# Patient Record
Sex: Female | Born: 1945 | Race: White | Hispanic: No | Marital: Married | Smoking: Never smoker
Health system: Southern US, Community
[De-identification: ages and names within clinical notes are randomized; demographics above are authoritative.]

## PROBLEM LIST (undated history)

## (undated) DIAGNOSIS — R51 Headache: Secondary | ICD-10-CM

## (undated) DIAGNOSIS — M069 Rheumatoid arthritis, unspecified: Secondary | ICD-10-CM

## (undated) DIAGNOSIS — R519 Headache, unspecified: Secondary | ICD-10-CM

## (undated) DIAGNOSIS — J45909 Unspecified asthma, uncomplicated: Secondary | ICD-10-CM

## (undated) DIAGNOSIS — L570 Actinic keratosis: Secondary | ICD-10-CM

## (undated) DIAGNOSIS — I10 Essential (primary) hypertension: Secondary | ICD-10-CM

## (undated) DIAGNOSIS — M1990 Unspecified osteoarthritis, unspecified site: Secondary | ICD-10-CM

## (undated) DIAGNOSIS — E039 Hypothyroidism, unspecified: Secondary | ICD-10-CM

## (undated) HISTORY — PX: DILATION AND CURETTAGE OF UTERUS: SHX78

## (undated) HISTORY — PX: APPENDECTOMY: SHX54

## (undated) HISTORY — PX: ABDOMINAL HYSTERECTOMY: SHX81

## (undated) HISTORY — DX: Actinic keratosis: L57.0

## (undated) HISTORY — PX: CHOLECYSTECTOMY: SHX55

---

## 2012-12-06 DEATH — deceased

## 2014-07-02 ENCOUNTER — Ambulatory Visit: Admit: 2014-07-02 | Disposition: A | Discharge status: Home / Self Care | Payer: Self-pay | Admitting: Internal Medicine

## 2014-09-18 ENCOUNTER — Encounter: Payer: Self-pay | Admitting: *Deleted

## 2014-09-19 ENCOUNTER — Ambulatory Visit
Admission: RE | Admit: 2014-09-19 | Discharge: 2014-09-19 | Disposition: A | Discharge status: Home / Self Care | Payer: Medicare Other | Source: Ambulatory Visit | Attending: Unknown Physician Specialty | Admitting: Unknown Physician Specialty

## 2014-09-19 ENCOUNTER — Ambulatory Visit: Payer: Medicare Other | Admitting: Anesthesiology

## 2014-09-19 ENCOUNTER — Encounter
Admission: RE | Disposition: A | Discharge status: Home / Self Care | Payer: Self-pay | Source: Ambulatory Visit | Attending: Unknown Physician Specialty

## 2014-09-19 DIAGNOSIS — K648 Other hemorrhoids: Secondary | ICD-10-CM | POA: Diagnosis not present

## 2014-09-19 DIAGNOSIS — I10 Essential (primary) hypertension: Secondary | ICD-10-CM | POA: Insufficient documentation

## 2014-09-19 DIAGNOSIS — Z79899 Other long term (current) drug therapy: Secondary | ICD-10-CM | POA: Insufficient documentation

## 2014-09-19 DIAGNOSIS — Z1211 Encounter for screening for malignant neoplasm of colon: Secondary | ICD-10-CM | POA: Diagnosis not present

## 2014-09-19 DIAGNOSIS — J45909 Unspecified asthma, uncomplicated: Secondary | ICD-10-CM | POA: Diagnosis not present

## 2014-09-19 DIAGNOSIS — Z7982 Long term (current) use of aspirin: Secondary | ICD-10-CM | POA: Diagnosis not present

## 2014-09-19 DIAGNOSIS — E039 Hypothyroidism, unspecified: Secondary | ICD-10-CM | POA: Insufficient documentation

## 2014-09-19 DIAGNOSIS — K5730 Diverticulosis of large intestine without perforation or abscess without bleeding: Secondary | ICD-10-CM | POA: Insufficient documentation

## 2014-09-19 HISTORY — DX: Headache, unspecified: R51.9

## 2014-09-19 HISTORY — DX: Unspecified osteoarthritis, unspecified site: M19.90

## 2014-09-19 HISTORY — DX: Headache: R51

## 2014-09-19 HISTORY — DX: Unspecified asthma, uncomplicated: J45.909

## 2014-09-19 HISTORY — DX: Hypothyroidism, unspecified: E03.9

## 2014-09-19 HISTORY — PX: COLONOSCOPY WITH PROPOFOL: SHX5780

## 2014-09-19 HISTORY — DX: Essential (primary) hypertension: I10

## 2014-09-19 SURGERY — COLONOSCOPY WITH PROPOFOL
Anesthesia: General

## 2014-09-19 MED ORDER — LIDOCAINE HCL (PF) 2 % IJ SOLN
INTRAMUSCULAR | Status: DC | PRN
  Administered 2014-09-19: 50 mg

## 2014-09-19 MED ORDER — SODIUM CHLORIDE 0.9 % IV SOLN: INTRAVENOUS | Status: DC

## 2014-09-19 MED ORDER — PROPOFOL 10 MG/ML IV BOLUS
INTRAVENOUS | Status: DC | PRN
  Administered 2014-09-19: 50 mg via INTRAVENOUS

## 2014-09-19 MED ORDER — FENTANYL CITRATE (PF) 100 MCG/2ML IJ SOLN
INTRAMUSCULAR | Status: DC | PRN
  Administered 2014-09-19: 50 ug via INTRAVENOUS

## 2014-09-19 MED ORDER — PHENYLEPHRINE HCL 10 MG/ML IJ SOLN
INTRAMUSCULAR | Status: DC | PRN
  Administered 2014-09-19 (×2): 100 ug via INTRAVENOUS

## 2014-09-19 MED ORDER — SODIUM CHLORIDE 0.9 % IV SOLN
INTRAVENOUS | Status: DC
  Administered 2014-09-19: 10:00:00 via INTRAVENOUS

## 2014-09-19 MED ORDER — SODIUM CHLORIDE 0.9 % IV SOLN
INTRAVENOUS | Status: DC
  Administered 2014-09-19: 1000 mL via INTRAVENOUS

## 2014-09-19 MED ORDER — ONDANSETRON HCL 4 MG/2ML IJ SOLN
INTRAMUSCULAR | Status: DC | PRN
  Administered 2014-09-19: 4 mg via INTRAVENOUS

## 2014-09-19 MED ORDER — PROPOFOL INFUSION 10 MG/ML OPTIME
INTRAVENOUS | Status: DC | PRN
  Administered 2014-09-19: 120 ug/kg/min via INTRAVENOUS

## 2014-09-19 MED ORDER — DEXAMETHASONE SODIUM PHOSPHATE 10 MG/ML IJ SOLN
INTRAMUSCULAR | Status: DC | PRN
  Administered 2014-09-19: 4 mg via INTRAVENOUS

## 2014-09-19 NOTE — Op Note (Signed)
[facility] Gastroenterology Patient Name: Jean Lawson Procedure Date: 09/19/2014 10:00 AM MRN: [id number] Account #: [id number] Date of Birth: May 17, 1945 Admit Type: Outpatient Age: 69 Room: [facility] ENDO ROOM 1 Gender: Female Note Status: Finalized Procedure:         Colonoscopy Indications:       Screening for colorectal malignant neoplasm Providers:         Manya Silvas, MD Referring MD:      Glendon Axe (Referring MD) Medicines:         Propofol per Anesthesia Complications:     No immediate complications. Procedure:         Pre-Anesthesia Assessment:                    - After reviewing the risks and benefits, the patient was                     deemed in satisfactory condition to undergo the procedure.                    After obtaining informed consent, the colonoscope was                     passed under direct vision. Throughout the procedure, the                     patient's blood pressure, pulse, and oxygen saturations                     were monitored continuously. The Colonoscope was                     introduced through the anus and advanced to the the cecum,                     identified by appendiceal orifice and ileocecal valve. The                     colonoscopy was somewhat difficult due to a tortuous                     colon. Successful completion of the procedure was aided by                     applying abdominal pressure. The patient tolerated the                     procedure well. The quality of the bowel preparation was                     excellent. Findings:      Multiple small and large-mouthed diverticula were found in the sigmoid       colon and in the descending colon. The colon was very tortuous in the       sigmoid colon.      Internal hemorrhoids were found during endoscopy. The hemorrhoids were       small and Grade I (internal hemorrhoids that do not prolapse).      The exam was otherwise without  abnormality. Impression:        - Diverticulosis in the sigmoid colon and in the                     descending colon.                    -  Internal hemorrhoids.                    - The examination was otherwise normal.                    - No specimens collected. Recommendation:    - The findings and recommendations were discussed with the                     patient's family. Manya Silvas, MD 09/19/2014 10:51:54 AM This report has been signed electronically. Number of Addenda: 0 Note Initiated On: 09/19/2014 10:00 AM Scope Withdrawal Time: 0 hours 9 minutes 42 seconds  Total Procedure Duration: 0 hours 32 minutes 8 seconds       [facility]

## 2014-09-19 NOTE — Transfer of Care (Signed)
Immediate Anesthesia Transfer of Care Note  Patient: Jean Lawson  Procedure(s) Performed: Procedure(s): COLONOSCOPY WITH PROPOFOL (N/A)  Patient Location: PACU  Anesthesia Type:General  Level of Consciousness: sedated  Airway & Oxygen Therapy: Patient Spontanous Breathing and Patient connected to nasal cannula oxygen  Post-op Assessment: Report given to RN and Post -op Vital signs reviewed and stable  Post vital signs: Reviewed and stable  Last Vitals:  Filed Vitals:   09/19/14 0944  BP: 131/67  Pulse: 90  Temp: 36.8 C  Resp: 16    Complications: No apparent anesthesia complications

## 2014-09-19 NOTE — Anesthesia Postprocedure Evaluation (Signed)
  Anesthesia Post-op Note  Patient: Jean Lawson  Procedure(s) Performed: Procedure(s): COLONOSCOPY WITH PROPOFOL (N/A)  Anesthesia type:General  Patient location: PACU  Post pain: Pain level controlled  Post assessment: Post-op Vital signs reviewed, Patient's Cardiovascular Status Stable, Respiratory Function Stable, Patent Airway and No signs of Nausea or vomiting  Post vital signs: Reviewed and stable  Last Vitals:  Filed Vitals:   09/19/14 1120  BP: 125/61  Pulse: 72  Temp:   Resp: 18    Level of consciousness: awake, alert  and patient cooperative  Complications: No apparent anesthesia complications

## 2014-09-19 NOTE — Anesthesia Preprocedure Evaluation (Addendum)
Anesthesia Evaluation  Patient identified by MRN, date of birth, ID band Patient awake    Reviewed: Allergy & Precautions, NPO status , Patient's Chart, lab work & pertinent test results  History of Anesthesia Complications (+) PONV  Airway Mallampati: II  TM Distance: >3 FB Neck ROM: Full    Dental no notable dental hx.    Pulmonary asthma ,  breath sounds clear to auscultation  Pulmonary exam normal       Cardiovascular hypertension, Pt. on medications Normal cardiovascular examRhythm:Regular Rate:Normal     Neuro/Psych  Headaches, negative psych ROS   GI/Hepatic negative GI ROS, Neg liver ROS,   Endo/Other  Hypothyroidism   Renal/GU negative Renal ROS  negative genitourinary   Musculoskeletal negative musculoskeletal ROS (+)   Abdominal   Peds negative pediatric ROS (+)  Hematology negative hematology ROS (+)   Anesthesia Other Findings   Reproductive/Obstetrics negative OB ROS                            Anesthesia Physical Anesthesia Plan  ASA: III  Anesthesia Plan: General   Post-op Pain Management:    Induction:   Airway Management Planned: Nasal Cannula  Additional Equipment:   Intra-op Plan:   Post-operative Plan:   Informed Consent: I have reviewed the patients History and Physical, chart, labs and discussed the procedure including the risks, benefits and alternatives for the proposed anesthesia with the patient or authorized representative who has indicated his/her understanding and acceptance.     Plan Discussed with: CRNA and Surgeon  Anesthesia Plan Comments:         Anesthesia Quick Evaluation

## 2014-09-19 NOTE — H&P (Signed)
Primary Care Physician:  Glendon Axe, MD Primary Gastroenterologist:  Dr. Vira Agar  Pre-Procedure History & Physical: HPI:  Jean Lawson is a 69 y.o. female is here for an colonoscopy.   Past Medical History  Diagnosis Date  . Hypothyroidism   . Headache   . Arthritis   . Hypertension   . Asthma     Past Surgical History  Procedure Laterality Date  . Appendectomy    . Cholecystectomy    . Dilation and curettage of uterus    . Abdominal hysterectomy      Prior to Admission medications   Medication Sig Start Date End Date Taking? Authorizing Provider  aspirin EC 81 MG tablet Take 81 mg by mouth daily.   Yes Historical Provider, MD  calcium carbonate (OS-CAL) 600 MG TABS tablet Take 600 mg by mouth 2 (two) times daily with a meal.   Yes Historical Provider, MD  etanercept (ENBREL) 25 MG injection Inject 25 mg into the skin.   Yes Historical Provider, MD  fluticasone (FLONASE) 50 MCG/ACT nasal spray Place into both nostrils daily.   Yes Historical Provider, MD  Fluticasone-Salmeterol (ADVAIR) 100-50 MCG/DOSE AEPB Inhale 1 puff into the lungs 2 (two) times daily.   Yes Historical Provider, MD  folic acid (FOLVITE) 1 MG tablet Take 1 mg by mouth daily.   Yes Historical Provider, MD  geriatric multivitamins-minerals (ELDERTONIC/GEVRABON) ELIX Take 15 mLs by mouth daily.   Yes Historical Provider, MD  hydrochlorothiazide (HYDRODIURIL) 25 MG tablet Take 25 mg by mouth daily.   Yes Historical Provider, MD  levocetirizine (XYZAL) 5 MG tablet Take 5 mg by mouth every evening.   Yes Historical Provider, MD  lisinopril (PRINIVIL,ZESTRIL) 5 MG tablet Take 5 mg by mouth daily.   Yes Historical Provider, MD  Melatonin 3 MG CAPS Take by mouth.   Yes Historical Provider, MD  methotrexate (RHEUMATREX) 2.5 MG tablet Take 7.5 mg by mouth 3 (three) times a week.   Yes Historical Provider, MD  nabumetone (RELAFEN) 500 MG tablet Take 500 mg by mouth daily.   Yes Historical Provider, MD  OMEGA-3  FATTY ACIDS PO Take by mouth.   Yes Historical Provider, MD  pravastatin (PRAVACHOL) 20 MG tablet Take 20 mg by mouth daily.   Yes Historical Provider, MD  predniSONE (DELTASONE) 2.5 MG tablet Take 2.5 mg by mouth daily with breakfast.   Yes Historical Provider, MD    Allergies as of 08/02/2014  . (Not on File)    No family history on file.  History   Social History  . Marital Status: Married    Spouse Name: N/A  . Number of Children: N/A  . Years of Education: N/A   Occupational History  . Not on file.   Social History Main Topics  . Smoking status: Not on file  . Smokeless tobacco: Not on file  . Alcohol Use: Not on file  . Drug Use: Not on file  . Sexual Activity: Not on file   Other Topics Concern  . Not on file   Social History Narrative  . No narrative on file    Review of Systems: See HPI, otherwise negative ROS  Physical Exam: BP 131/67 mmHg  Pulse 90  Temp(Src) 98.3 F (36.8 C) (Tympanic)  Resp 16  Ht 5' 5.5" (1.664 m)  Wt 54.432 kg (120 lb)  BMI 19.66 kg/m2  SpO2 100% General:   Alert,  pleasant and cooperative in NAD Head:  Normocephalic and atraumatic. Neck:  Supple;  no masses or thyromegaly. Lungs:  Clear throughout to auscultation.    Heart:  Regular rate and rhythm. Abdomen:  Soft, nontender and nondistended. Normal bowel sounds, without guarding, and without rebound.   Neurologic:  Alert and  oriented x4;  grossly normal neurologically.  Impression/Plan: Jean Lawson is here for an colonoscopy to be performed for screening  Risks, benefits, limitations, and alternatives regarding  colonoscopy have been reviewed with the patient.  Questions have been answered.  All parties agreeable.   Gaylyn Cheers, MD  09/19/2014, 9:49 AM   Primary Care Physician:  Glendon Axe, MD Primary Gastroenterologist:  Dr. Vira Agar  Pre-Procedure History & Physical: HPI:  Jean Lawson is a 69 y.o. female is here for an colonoscopy.   Past Medical  History  Diagnosis Date  . Hypothyroidism   . Headache   . Arthritis   . Hypertension   . Asthma     Past Surgical History  Procedure Laterality Date  . Appendectomy    . Cholecystectomy    . Dilation and curettage of uterus    . Abdominal hysterectomy      Prior to Admission medications   Medication Sig Start Date End Date Taking? Authorizing Provider  aspirin EC 81 MG tablet Take 81 mg by mouth daily.   Yes Historical Provider, MD  calcium carbonate (OS-CAL) 600 MG TABS tablet Take 600 mg by mouth 2 (two) times daily with a meal.   Yes Historical Provider, MD  etanercept (ENBREL) 25 MG injection Inject 25 mg into the skin.   Yes Historical Provider, MD  fluticasone (FLONASE) 50 MCG/ACT nasal spray Place into both nostrils daily.   Yes Historical Provider, MD  Fluticasone-Salmeterol (ADVAIR) 100-50 MCG/DOSE AEPB Inhale 1 puff into the lungs 2 (two) times daily.   Yes Historical Provider, MD  folic acid (FOLVITE) 1 MG tablet Take 1 mg by mouth daily.   Yes Historical Provider, MD  geriatric multivitamins-minerals (ELDERTONIC/GEVRABON) ELIX Take 15 mLs by mouth daily.   Yes Historical Provider, MD  hydrochlorothiazide (HYDRODIURIL) 25 MG tablet Take 25 mg by mouth daily.   Yes Historical Provider, MD  levocetirizine (XYZAL) 5 MG tablet Take 5 mg by mouth every evening.   Yes Historical Provider, MD  lisinopril (PRINIVIL,ZESTRIL) 5 MG tablet Take 5 mg by mouth daily.   Yes Historical Provider, MD  Melatonin 3 MG CAPS Take by mouth.   Yes Historical Provider, MD  methotrexate (RHEUMATREX) 2.5 MG tablet Take 7.5 mg by mouth 3 (three) times a week.   Yes Historical Provider, MD  nabumetone (RELAFEN) 500 MG tablet Take 500 mg by mouth daily.   Yes Historical Provider, MD  OMEGA-3 FATTY ACIDS PO Take by mouth.   Yes Historical Provider, MD  pravastatin (PRAVACHOL) 20 MG tablet Take 20 mg by mouth daily.   Yes Historical Provider, MD  predniSONE (DELTASONE) 2.5 MG tablet Take 2.5 mg by  mouth daily with breakfast.   Yes Historical Provider, MD    Allergies as of 08/02/2014  . (Not on File)    No family history on file.  History   Social History  . Marital Status: Married    Spouse Name: N/A  . Number of Children: N/A  . Years of Education: N/A   Occupational History  . Not on file.   Social History Main Topics  . Smoking status: Not on file  . Smokeless tobacco: Not on file  . Alcohol Use: Not on file  . Drug Use: Not on  file  . Sexual Activity: Not on file   Other Topics Concern  . Not on file   Social History Narrative  . No narrative on file    Review of Systems: See HPI, otherwise negative ROS  Physical Exam: BP 131/67 mmHg  Pulse 90  Temp(Src) 98.3 F (36.8 C) (Tympanic)  Resp 16  Ht 5' 5.5" (1.664 m)  Wt 54.432 kg (120 lb)  BMI 19.66 kg/m2  SpO2 100% General:   Alert,  pleasant and cooperative in NAD Head:  Normocephalic and atraumatic. Neck:  Supple; no masses or thyromegaly. Lungs:  Clear throughout to auscultation.    Heart:  Regular rate and rhythm. Abdomen:  Soft, nontender and nondistended. Normal bowel sounds, without guarding, and without rebound.   Neurologic:  Alert and  oriented x4;  grossly normal neurologically.  Impression/Plan: Jean Lawson is here for an colonoscopy to be performed for screening  Risks, benefits, limitations, and alternatives regarding  colonoscopy have been reviewed with the patient.  Questions have been answered.  All parties agreeable.   Gaylyn Cheers, MD  09/19/2014, 9:49 AM

## 2014-09-30 ENCOUNTER — Encounter: Payer: Self-pay | Admitting: Unknown Physician Specialty

## 2015-06-27 ENCOUNTER — Other Ambulatory Visit: Payer: Self-pay | Admitting: Internal Medicine

## 2015-06-27 DIAGNOSIS — Z1239 Encounter for other screening for malignant neoplasm of breast: Secondary | ICD-10-CM

## 2015-07-07 ENCOUNTER — Ambulatory Visit
Admission: RE | Admit: 2015-07-07 | Discharge: 2015-07-07 | Disposition: A | Discharge status: Home / Self Care | Payer: Medicare Other | Source: Ambulatory Visit | Attending: Internal Medicine | Admitting: Internal Medicine

## 2015-07-07 ENCOUNTER — Other Ambulatory Visit: Payer: Self-pay | Admitting: Internal Medicine

## 2015-07-07 DIAGNOSIS — Z1239 Encounter for other screening for malignant neoplasm of breast: Secondary | ICD-10-CM

## 2015-07-07 DIAGNOSIS — Z1231 Encounter for screening mammogram for malignant neoplasm of breast: Secondary | ICD-10-CM | POA: Diagnosis not present

## 2016-05-26 ENCOUNTER — Other Ambulatory Visit: Payer: Self-pay | Admitting: Internal Medicine

## 2016-07-28 ENCOUNTER — Other Ambulatory Visit: Payer: Self-pay | Admitting: Internal Medicine

## 2016-07-28 DIAGNOSIS — Z1231 Encounter for screening mammogram for malignant neoplasm of breast: Secondary | ICD-10-CM

## 2016-08-12 ENCOUNTER — Ambulatory Visit
Admission: RE | Admit: 2016-08-12 | Discharge: 2016-08-12 | Disposition: A | Discharge status: Home / Self Care | Payer: Medicare Other | Source: Ambulatory Visit | Attending: Internal Medicine | Admitting: Internal Medicine

## 2016-08-12 DIAGNOSIS — Z1231 Encounter for screening mammogram for malignant neoplasm of breast: Secondary | ICD-10-CM | POA: Insufficient documentation

## 2017-07-04 ENCOUNTER — Other Ambulatory Visit: Payer: Self-pay | Admitting: Internal Medicine

## 2017-07-04 DIAGNOSIS — Z1231 Encounter for screening mammogram for malignant neoplasm of breast: Secondary | ICD-10-CM

## 2017-08-16 ENCOUNTER — Ambulatory Visit
Admission: RE | Admit: 2017-08-16 | Discharge: 2017-08-16 | Disposition: A | Discharge status: Home / Self Care | Payer: Medicare Other | Source: Ambulatory Visit | Attending: Internal Medicine | Admitting: Internal Medicine

## 2017-08-16 DIAGNOSIS — Z1231 Encounter for screening mammogram for malignant neoplasm of breast: Secondary | ICD-10-CM | POA: Diagnosis present

## 2018-07-20 ENCOUNTER — Other Ambulatory Visit: Payer: Self-pay | Admitting: Internal Medicine

## 2018-07-20 DIAGNOSIS — Z1231 Encounter for screening mammogram for malignant neoplasm of breast: Secondary | ICD-10-CM

## 2018-08-23 ENCOUNTER — Ambulatory Visit
Admission: RE | Admit: 2018-08-23 | Discharge: 2018-08-23 | Disposition: A | Discharge status: Home / Self Care | Payer: Medicare Other | Source: Ambulatory Visit | Attending: Internal Medicine | Admitting: Internal Medicine

## 2018-08-23 ENCOUNTER — Other Ambulatory Visit: Payer: Self-pay

## 2018-08-23 DIAGNOSIS — Z1231 Encounter for screening mammogram for malignant neoplasm of breast: Secondary | ICD-10-CM | POA: Insufficient documentation

## 2019-02-14 ENCOUNTER — Other Ambulatory Visit: Payer: Self-pay

## 2019-02-14 DIAGNOSIS — Z20822 Contact with and (suspected) exposure to covid-19: Secondary | ICD-10-CM

## 2019-02-15 LAB — NOVEL CORONAVIRUS, NAA: SARS-CoV-2, NAA: NOT DETECTED

## 2019-03-07 ENCOUNTER — Other Ambulatory Visit: Payer: Self-pay | Admitting: Internal Medicine

## 2019-03-07 DIAGNOSIS — Z1231 Encounter for screening mammogram for malignant neoplasm of breast: Secondary | ICD-10-CM

## 2019-08-24 ENCOUNTER — Ambulatory Visit
Admission: RE | Admit: 2019-08-24 | Discharge: 2019-08-24 | Disposition: A | Discharge status: Home / Self Care | Payer: Medicare Other | Source: Ambulatory Visit | Attending: Internal Medicine | Admitting: Internal Medicine

## 2019-08-24 DIAGNOSIS — Z1231 Encounter for screening mammogram for malignant neoplasm of breast: Secondary | ICD-10-CM

## 2019-11-28 ENCOUNTER — Other Ambulatory Visit: Payer: Self-pay | Admitting: Internal Medicine

## 2019-11-28 ENCOUNTER — Inpatient Hospital Stay
Admission: EM | Admit: 2019-11-28 | Discharge: 2019-12-01 | DRG: 683 | Disposition: A | Discharge status: Home Health | Payer: Medicare Other | Source: Ambulatory Visit | Attending: Internal Medicine | Admitting: Internal Medicine

## 2019-11-28 ENCOUNTER — Other Ambulatory Visit: Payer: Self-pay

## 2019-11-28 ENCOUNTER — Ambulatory Visit
Admission: RE | Admit: 2019-11-28 | Discharge: 2019-11-28 | Disposition: A | Discharge status: Home / Self Care | Payer: Medicare Other | Source: Ambulatory Visit | Attending: Internal Medicine | Admitting: Internal Medicine

## 2019-11-28 ENCOUNTER — Emergency Department: Payer: Medicare Other

## 2019-11-28 DIAGNOSIS — Z79899 Other long term (current) drug therapy: Secondary | ICD-10-CM | POA: Diagnosis not present

## 2019-11-28 DIAGNOSIS — M069 Rheumatoid arthritis, unspecified: Secondary | ICD-10-CM | POA: Diagnosis present

## 2019-11-28 DIAGNOSIS — I959 Hypotension, unspecified: Secondary | ICD-10-CM | POA: Diagnosis present

## 2019-11-28 DIAGNOSIS — J45909 Unspecified asthma, uncomplicated: Secondary | ICD-10-CM | POA: Diagnosis present

## 2019-11-28 DIAGNOSIS — N179 Acute kidney failure, unspecified: Principal | ICD-10-CM

## 2019-11-28 DIAGNOSIS — E875 Hyperkalemia: Secondary | ICD-10-CM | POA: Diagnosis present

## 2019-11-28 DIAGNOSIS — R197 Diarrhea, unspecified: Secondary | ICD-10-CM

## 2019-11-28 DIAGNOSIS — R112 Nausea with vomiting, unspecified: Secondary | ICD-10-CM

## 2019-11-28 DIAGNOSIS — Z66 Do not resuscitate: Secondary | ICD-10-CM | POA: Diagnosis present

## 2019-11-28 DIAGNOSIS — E876 Hypokalemia: Secondary | ICD-10-CM | POA: Diagnosis present

## 2019-11-28 DIAGNOSIS — Z7982 Long term (current) use of aspirin: Secondary | ICD-10-CM | POA: Diagnosis not present

## 2019-11-28 DIAGNOSIS — D638 Anemia in other chronic diseases classified elsewhere: Secondary | ICD-10-CM | POA: Diagnosis present

## 2019-11-28 DIAGNOSIS — E861 Hypovolemia: Secondary | ICD-10-CM | POA: Diagnosis present

## 2019-11-28 DIAGNOSIS — K529 Noninfective gastroenteritis and colitis, unspecified: Secondary | ICD-10-CM

## 2019-11-28 DIAGNOSIS — R7401 Elevation of levels of liver transaminase levels: Secondary | ICD-10-CM | POA: Diagnosis present

## 2019-11-28 DIAGNOSIS — E785 Hyperlipidemia, unspecified: Secondary | ICD-10-CM | POA: Diagnosis present

## 2019-11-28 DIAGNOSIS — R1032 Left lower quadrant pain: Secondary | ICD-10-CM

## 2019-11-28 DIAGNOSIS — Z7951 Long term (current) use of inhaled steroids: Secondary | ICD-10-CM | POA: Diagnosis not present

## 2019-11-28 DIAGNOSIS — Z7952 Long term (current) use of systemic steroids: Secondary | ICD-10-CM | POA: Diagnosis not present

## 2019-11-28 DIAGNOSIS — E872 Acidosis: Secondary | ICD-10-CM | POA: Diagnosis present

## 2019-11-28 DIAGNOSIS — I10 Essential (primary) hypertension: Secondary | ICD-10-CM | POA: Diagnosis present

## 2019-11-28 DIAGNOSIS — Z20822 Contact with and (suspected) exposure to covid-19: Secondary | ICD-10-CM | POA: Diagnosis present

## 2019-11-28 DIAGNOSIS — E871 Hypo-osmolality and hyponatremia: Secondary | ICD-10-CM | POA: Diagnosis present

## 2019-11-28 LAB — COMPREHENSIVE METABOLIC PANEL
ALT: 103 U/L — ABNORMAL HIGH (ref 0–44)
AST: 114 U/L — ABNORMAL HIGH (ref 15–41)
Albumin: 3.1 g/dL — ABNORMAL LOW (ref 3.5–5.0)
Alkaline Phosphatase: 77 U/L (ref 38–126)
Anion gap: 17 — ABNORMAL HIGH (ref 5–15)
BUN: 103 mg/dL — ABNORMAL HIGH (ref 8–23)
CO2: 17 mmol/L — ABNORMAL LOW (ref 22–32)
Calcium: 8.7 mg/dL — ABNORMAL LOW (ref 8.9–10.3)
Chloride: 99 mmol/L (ref 98–111)
Creatinine, Ser: 6.68 mg/dL — ABNORMAL HIGH (ref 0.44–1.00)
GFR calc Af Amer: 6 mL/min — ABNORMAL LOW (ref >60)
GFR calc non Af Amer: 6 mL/min — ABNORMAL LOW (ref >60)
Glucose, Bld: 104 mg/dL — ABNORMAL HIGH (ref 70–99)
Potassium: 5.3 mmol/L — ABNORMAL HIGH (ref 3.5–5.1)
Sodium: 133 mmol/L — ABNORMAL LOW (ref 135–145)
Total Bilirubin: 1.6 mg/dL — ABNORMAL HIGH (ref 0.3–1.2)
Total Protein: 7.4 g/dL (ref 6.5–8.1)

## 2019-11-28 LAB — CBC
HCT: 40 % (ref 36.0–46.0)
Hemoglobin: 13.8 g/dL (ref 12.0–15.0)
MCH: 30.8 pg (ref 26.0–34.0)
MCHC: 34.5 g/dL (ref 30.0–36.0)
MCV: 89.3 fL (ref 80.0–100.0)
Platelets: 332 10*3/uL (ref 150–400)
RBC: 4.48 MIL/uL (ref 3.87–5.11)
RDW: 14.2 % (ref 11.5–15.5)
WBC: 11.7 10*3/uL — ABNORMAL HIGH (ref 4.0–10.5)
nRBC: 0 % (ref 0.0–0.2)

## 2019-11-28 LAB — SARS CORONAVIRUS 2 BY RT PCR (HOSPITAL ORDER, PERFORMED IN ~~LOC~~ HOSPITAL LAB): SARS Coronavirus 2: NEGATIVE

## 2019-11-28 MED ORDER — DICYCLOMINE HCL 20 MG PO TABS
20.0000 mg | ORAL_TABLET | Freq: Four times a day (QID) | ORAL | Status: DC | PRN
  Filled 2019-11-28: qty 1

## 2019-11-28 MED ORDER — SODIUM CHLORIDE 0.9 % IV BOLUS
1000.0000 mL | Freq: Once | INTRAVENOUS | Status: AC
  Administered 2019-11-28: 1000 mL via INTRAVENOUS

## 2019-11-28 MED ORDER — CIPROFLOXACIN IN D5W 400 MG/200ML IV SOLN
400.0000 mg | INTRAVENOUS | Status: DC
  Administered 2019-11-29: 400 mg via INTRAVENOUS
  Filled 2019-11-28 (×3): qty 200

## 2019-11-28 MED ORDER — LACTATED RINGERS IV SOLN: INTRAVENOUS | Status: DC

## 2019-11-28 MED ORDER — HEPARIN SODIUM (PORCINE) 5000 UNIT/ML IJ SOLN: 5000.0000 [IU] | Freq: Three times a day (TID) | INTRAMUSCULAR | Status: DC

## 2019-11-28 MED ORDER — CIPROFLOXACIN IN D5W 400 MG/200ML IV SOLN
400.0000 mg | Freq: Once | INTRAVENOUS | Status: DC
  Filled 2019-11-28: qty 200

## 2019-11-28 MED ORDER — ENOXAPARIN SODIUM 40 MG/0.4ML ~~LOC~~ SOLN
40.0000 mg | SUBCUTANEOUS | Status: DC
  Administered 2019-11-28: 40 mg via SUBCUTANEOUS

## 2019-11-28 MED ORDER — ONDANSETRON HCL 4 MG/2ML IJ SOLN
4.0000 mg | Freq: Once | INTRAMUSCULAR | Status: AC
  Administered 2019-11-28: 4 mg via INTRAVENOUS
  Filled 2019-11-28: qty 2

## 2019-11-28 MED ORDER — METRONIDAZOLE IN NACL 5-0.79 MG/ML-% IV SOLN
500.0000 mg | Freq: Once | INTRAVENOUS | Status: AC
  Administered 2019-11-29: 500 mg via INTRAVENOUS
  Filled 2019-11-28: qty 100

## 2019-11-28 MED ORDER — ONDANSETRON HCL 4 MG/2ML IJ SOLN
4.0000 mg | Freq: Four times a day (QID) | INTRAMUSCULAR | Status: DC | PRN
  Administered 2019-11-30: 4 mg via INTRAVENOUS
  Filled 2019-11-28: qty 2

## 2019-11-28 MED ORDER — ALUM & MAG HYDROXIDE-SIMETH 200-200-20 MG/5ML PO SUSP: 15.0000 mL | Freq: Once | ORAL | Status: DC

## 2019-11-28 MED ORDER — METRONIDAZOLE IN NACL 5-0.79 MG/ML-% IV SOLN
500.0000 mg | Freq: Three times a day (TID) | INTRAVENOUS | Status: DC
  Administered 2019-11-29 – 2019-12-01 (×8): 500 mg via INTRAVENOUS
  Filled 2019-11-28 (×12): qty 100

## 2019-11-28 NOTE — H&P (Signed)
History and Physical    Jean Lawson JHE:[id number] DOB: Aug 03, 1945 DOA: 11/28/2019  PCP: Baxter Hire, MD  Patient coming from: Home, husband at bedside  I have personally briefly reviewed patient's old medical records in [place]  Chief Complaint: Acute renal failure  HPI: Jean Lawson is a 74 y.o. female with medical history significant for rheumatoid arthritis on immunosuppressive's, hypertension, GERD and hyperlipidemia who presented at the advice of her primary physician for acute renal failure.  Patient had a outpatient cataract surgery on 9/17 and shortly following the surgery when she got home she began to have profuse nausea and vomiting for about 3 to 4 hours. She then had another 4 hours of diarrhea. She also fell later that day getting up from the commode due to generalized weakness and has been since she had loss of consciousness for about 15 seconds. Her vomiting has since resolved but she has been having occasional on and off diarrhea. However she took an Imodium today and her diarrhea has stopped.  About 2 days ago she also began to notice decreased urine output. Has been having decreased p.o. intake. Patient reports that she has had trouble with anesthesia in the past and thinks it triggered all of this. She denies any fever. No sick contact. Denies any recent antibiotics. Also has been having crampy abdominal pain. She also notes discomfort to her chest/epigastric region and has been having trouble taking deep breaths. She saw her primary physician today for follow-up and had lab work showing elevated creatinine of 6.8 from a prior of 0.9.  ED Course: Patient has not had any more episodes of vomiting or diarrhea since being in the ED. She was afebrile normotensive on room air. Had mild leukocytosis of 11.7. Sodium 133, potassium 5.3, glucose 104, creatinine of 6.68 with BUN of 103, elevated AST of 114, ALT of 103, anion gap of 17.  CT abdomen and pelvis showed  diffuse circumferential wall thickening of the descending colon to the level of the sigmoid colon consistent with infectious or inflammatory colitis.  She was given 2 liter of IV normal saline and started on Cipro and Flagyl in the ED. Review of Systems: Constitutional: No Weight Change, No Fever ENT/Mouth: No sore throat, No Rhinorrhea Eyes: No Eye Pain, No Vision Changes Cardiovascular: No Chest Pain, no SOB Respiratory: No Cough, No Sputum Gastrointestinal: + Nausea, No Vomiting,+ Diarrhea, No Constipation, + Pain Genitourinary: no Urinary Incontinence Musculoskeletal: No Arthralgias, No Myalgias Skin: No Skin Lesions, No Pruritus, Neuro: no Weakness, No Numbness Psych: No Anxiety/Panic, No Depression, no decrease appetite Heme/Lymph: No Bruising, No Bleeding Past Medical History:  Diagnosis Date  . Arthritis   . Asthma   . Headache   . Hypertension   . Hypothyroidism     Past Surgical History:  Procedure Laterality Date  . ABDOMINAL HYSTERECTOMY    . APPENDECTOMY    . CHOLECYSTECTOMY    . COLONOSCOPY WITH PROPOFOL N/A 09/19/2014   Procedure: COLONOSCOPY WITH PROPOFOL;  Surgeon: Manya Silvas, MD;  Location: [facility] ENDOSCOPY;  Service: Endoscopy;  Laterality: N/A;  . DILATION AND CURETTAGE OF UTERUS      No tobacco, alcohol or illicit drug use Social History  Allergies  Allergen Reactions  . Codeine   . Latex   . Oatmeal   . Penicillins   . Shellfish Allergy   . Sulfa Antibiotics     Family History  Problem Relation Age of Onset  . Breast cancer Neg Hx  Prior to Admission medications   Medication Sig Start Date End Date Taking? Authorizing Provider  aspirin EC 81 MG tablet Take 81 mg by mouth daily.    [provider]  calcium carbonate (OS-CAL) 600 MG TABS tablet Take 600 mg by mouth 2 (two) times daily with a meal.    [provider]  etanercept (ENBREL) 25 MG injection Inject 25 mg into the skin.    [provider]   fluticasone (FLONASE) 50 MCG/ACT nasal spray Place into both nostrils daily.    [provider]  Fluticasone-Salmeterol (ADVAIR) 100-50 MCG/DOSE AEPB Inhale 1 puff into the lungs 2 (two) times daily.    [provider]  folic acid (FOLVITE) 1 MG tablet Take 1 mg by mouth daily.    [provider]  geriatric multivitamins-minerals (ELDERTONIC/GEVRABON) ELIX Take 15 mLs by mouth daily.    [provider]  hydrochlorothiazide (HYDRODIURIL) 25 MG tablet Take 25 mg by mouth daily.    [provider]  levocetirizine (XYZAL) 5 MG tablet Take 5 mg by mouth every evening.    [provider]  lisinopril (PRINIVIL,ZESTRIL) 5 MG tablet Take 5 mg by mouth daily.    [provider]  Melatonin 3 MG CAPS Take by mouth.    [provider]  methotrexate (RHEUMATREX) 2.5 MG tablet Take 7.5 mg by mouth 3 (three) times a week.    [provider]  nabumetone (RELAFEN) 500 MG tablet Take 500 mg by mouth daily.    [provider]  OMEGA-3 FATTY ACIDS PO Take by mouth.    [provider]  pravastatin (PRAVACHOL) 20 MG tablet Take 20 mg by mouth daily.    [provider]  predniSONE (DELTASONE) 2.5 MG tablet Take 2.5 mg by mouth daily with breakfast.    [provider]    Physical Exam: Vitals:   11/28/19 1527 11/28/19 1528 11/28/19 2100  BP: 136/73  117/83  Pulse: 95    Resp: 18    Temp: 98.6 F (37 C)    TempSrc: Oral    SpO2: 100%    Weight:  53.5 kg   Height:  4\' 11"  (1.499 m)     Constitutional: NAD, calm, comfortable, nontoxic appearing elderly female appearing younger than her stated age sitting upright in bed Vitals:   11/28/19 1527 11/28/19 1528 11/28/19 2100  BP: 136/73  117/83  Pulse: 95    Resp: 18    Temp: 98.6 F (37 C)    TempSrc: Oral    SpO2: 100%    Weight:  53.5 kg   Height:  4\' 11"  (1.499 m)    Eyes: PERRL, lids and conjunctivae normal ENMT: Mucous membranes are  moist.  Neck: normal, supple Respiratory: clear to auscultation bilaterally, no wheezing, no crackles. Normal respiratory effort. No accessory muscle use.  Cardiovascular: Regular rate and rhythm, no murmurs / rubs / gallops. No extremity edema.   Abdomen: no tenderness, no masses palpated. Bowel sounds positive.  Musculoskeletal: no clubbing / cyanosis. No joint deformity upper and lower extremities. Good ROM, no contractures. Normal muscle tone.  Skin: no rashes, lesions, ulcers. No induration Neurologic: CN 2-12 grossly intact. Sensation intact, Strength 5/5 in all 4.  Psychiatric: Normal judgment and insight. Alert and oriented x 3. Normal mood.     Labs on Admission: I have personally reviewed following labs and imaging studies  CBC: Recent Labs  Lab 11/28/19 1914  WBC 11.7*  HGB 13.8  HCT 40.0  MCV 89.3  PLT 614   Basic Metabolic Panel: Recent Labs  Lab 11/28/19 1914  NA 133*  K 5.3*  CL 99  CO2 17*  GLUCOSE 104*  BUN 103*  CREATININE 6.68*  CALCIUM 8.7*   GFR: Estimated Creatinine Clearance: 5.4 mL/min (A) (by C-G formula based on SCr of 6.68 mg/dL (H)). Liver Function Tests: Recent Labs  Lab 11/28/19 1914  AST 114*  ALT 103*  ALKPHOS 77  BILITOT 1.6*  PROT 7.4  ALBUMIN 3.1*   No results for input(s): LIPASE, AMYLASE in the last 168 hours. No results for input(s): AMMONIA in the last 168 hours. Coagulation Profile: No results for input(s): INR, PROTIME in the last 168 hours. Cardiac Enzymes: No results for input(s): CKTOTAL, CKMB, CKMBINDEX, TROPONINI in the last 168 hours. BNP (last 3 results) No results for input(s): PROBNP in the last 8760 hours. HbA1C: No results for input(s): HGBA1C in the last 72 hours. CBG: No results for input(s): GLUCAP in the last 168 hours. Lipid Profile: No results for input(s): CHOL, HDL, LDLCALC, TRIG, CHOLHDL, LDLDIRECT in the last 72 hours. Thyroid Function Tests: No results for input(s): TSH, T4TOTAL, FREET4,  T3FREE, THYROIDAB in the last 72 hours. Anemia Panel: No results for input(s): VITAMINB12, FOLATE, FERRITIN, TIBC, IRON, RETICCTPCT in the last 72 hours. Urine analysis: No results found for: COLORURINE, APPEARANCEUR, [place], [place], GLUCOSEU, HGBUR, BILIRUBINUR, KETONESUR, PROTEINUR, UROBILINOGEN, NITRITE, LEUKOCYTESUR  Radiological Exams on Admission: CT ABDOMEN PELVIS WO CONTRAST  Result Date: 11/28/2019 CLINICAL DATA:  Nausea and lower abdominal pain. EXAM: CT ABDOMEN AND PELVIS WITHOUT CONTRAST TECHNIQUE: Multidetector CT imaging of the abdomen and pelvis was performed following the standard protocol without IV contrast. COMPARISON:  None. FINDINGS: Lower chest: The lung bases are clear. The heart size is normal. Hepatobiliary: There is decreased hepatic attenuation suggestive of hepatic steatosis. Normal gallbladder.There is no biliary ductal dilation. Pancreas: Normal contours without ductal dilatation. No peripancreatic fluid collection. Spleen: The spleen itself is unremarkable. 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 Given their size, no further follow-up is necessary. Adrenals/Urinary Tract: --Adrenal glands: Unremarkable. --Right kidney/ureter: No hydronephrosis or radiopaque kidney stones. --Left kidney/ureter: No hydronephrosis or radiopaque kidney stones. --Urinary bladder: There is diffuse bladder wall thickening which may be secondary to underdistention. Stomach/Bowel: --Stomach/Duodenum: No hiatal hernia or other gastric abnormality. Normal duodenal course and caliber. --Small bowel: Unremarkable. --Colon: There is sigmoid diverticulosis. There is diffuse circumferential wall thickening of the descending colon to the level of the sigmoid colon. There is no abscess. No extraluminal free air. --Appendix: Normal. Vascular/Lymphatic: Atherosclerotic calcification is present within the non-aneurysmal abdominal aorta, without hemodynamically  significant stenosis. --No retroperitoneal lymphadenopathy. --No mesenteric lymphadenopathy. --No pelvic or inguinal lymphadenopathy. Reproductive: Status post hysterectomy. No adnexal mass. Other: No ascites or free air. The abdominal wall is normal. Musculoskeletal. Multilevel degenerative changes are noted throughout the lumbar spine, greatest at the L4-L5 and L5-S1 levels. IMPRESSION: 1. Diffuse circumferential wall thickening of the descending colon to the level of the sigmoid colon, consistent with infectious or inflammatory colitis. 2. Sigmoid diverticulosis without CT evidence for diverticulitis. 3. Hepatic steatosis. Aortic Atherosclerosis (ICD10-I70.0). Electronically Signed   By: Constance Holster M.D.   On: 11/28/2019 21:22      Assessment/Plan Acute renal failure Likely majority prerenal could have progressed to more intrinsic renal injury due to significantly elevated creatinine of 6.6 from prior of 0.9. Obtain FENA labs Has received 2 L of IV normal saline fluid will  continue IV LR infusion Follow creatinine in the morning Avoid nephrotoxic agent  Colitis likely due to gastroenteritis but could have also been triggered by sensitivity to anesthesia from recent surgery Continue IV Cipro and Flagyl Clear liquid diet for bowel rest PRN Zofran   Mild hyperkalemia No changes seen on EKG. Likely due to renal insufficiency. Suspect this will improve with fluids. Follow with morning labs  Transaminitis Likely due to hypovolemia from GI symptoms. CT abdomen also shows hepatic steatosis. Follow CMP in the morning.  Rheumatoid arthritis Hold immunosuppressive's for now while on antibiotics  Hypertension Hold Lisinopril and HCTZ due to AKI  DVT prophylaxis:.Heparin SQ Code Status: DNR- confirmed with patient and husband at bedside Family Communication: Plan discussed with patient at bedside  disposition Plan: Home with at least 2 midnight stays  Consults called:  Admission  status: inpatient  Status is: Inpatient  Remains inpatient appropriate because:Inpatient level of care appropriate due to severity of illness   Dispo: The patient is from: Home              Anticipated d/c is to: Home              Anticipated d/c date is: 3 days              Patient currently is not medically stable to d/c.         Orene Desanctis DO [facility]   If 7PM-7AM, please contact night-coverage [email or web address]   11/28/2019, 11:12 PM

## 2019-11-28 NOTE — ED Notes (Signed)
Meal tray and po fluids provided 

## 2019-11-28 NOTE — ED Triage Notes (Signed)
Pt here with abnormal labs from her provider. Pt has nausea, lower abd pain, and SOB. Pt NAD in traige.

## 2019-11-28 NOTE — ED Notes (Signed)
First Nurse Note:  This RN received call from [place], MD w/ [facility], notification of expected patient arrival; states she is being sent over due to acute renal failure w/ Creatinine 6.8, BUN 101.

## 2019-11-28 NOTE — Progress Notes (Signed)
Pharmacy Antibiotic Note  Jean Lawson is a 74 y.o. female admitted on 11/28/2019 with colitis.  Pharmacy has been consulted for ciprofloxacin dosing.  Pt currently in ARF,  CrCl = 5.4 ml/min  Plan: Ciprofloxacin 400 mg IV X 1 ordered for ER on 9/22 @ 2300. Will continue ciprofloxacin 400 mg IV Q24H on 9/23 @ 2300.   Height: 4\' 11"  (149.9 cm) Weight: 53.5 kg (118 lb) IBW/kg (Calculated) : 43.2  Temp (24hrs), Avg:98.6 F (37 C), Min:98.6 F (37 C), Max:98.6 F (37 C)  Recent Labs  Lab 11/28/19 1914  WBC 11.7*  CREATININE 6.68*    Estimated Creatinine Clearance: 5.4 mL/min (A) (by C-G formula based on SCr of 6.68 mg/dL (H)).    Allergies  Allergen Reactions  . Codeine   . Latex   . Oatmeal   . Penicillins   . Shellfish Allergy   . Sulfa Antibiotics     Antimicrobials this admission:   >>    >>   Dose adjustments this admission:   Microbiology results:  BCx:   UCx:    Sputum:    MRSA PCR:   Thank you for allowing pharmacy to be a part of this patient's care.  Halton Neas D 11/28/2019 11:08 PM

## 2019-11-28 NOTE — ED Notes (Signed)
Patient transported to CT 

## 2019-11-28 NOTE — ED Notes (Signed)
Pt had blood work done at [facility].

## 2019-11-28 NOTE — ED Provider Notes (Signed)
[facility] Emergency Department Provider Note  Time seen: 9:01 PM  I have reviewed the triage vital signs and the nursing notes.   HISTORY  Chief Complaint Nausea   HPI Jean Lawson is a 74 y.o. female with a past medical history of arthritis, asthma, hypertension, presents to the emergency department for abnormal labs.  Patient states 6 days ago she had a cataract surgery requiring anesthesia with Versed.  Patient states she does really poorly with anesthesia.  She states shortly after going home she began vomiting uncontrollably followed by diarrhea.  States that day she had heavy diarrhea and since then she has had mild but fairly constant diarrhea.  Patient took loperamide and that stopped the diarrhea but the patient continued to feel weak, followed up with her doctor and had lab work performed showing renal failure and was told to come to the emergency department.  Patient denies any vomiting since the first night after her surgery.  Denies any fever cough shortness of breath.  Patient does state fairly diffuse abdominal cramping moderate in severity.  Patient is status post appendectomy, cholecystectomy.   Past Medical History:  Diagnosis Date  . Arthritis   . Asthma   . Headache   . Hypertension   . Hypothyroidism     There are no problems to display for this patient.   Past Surgical History:  Procedure Laterality Date  . ABDOMINAL HYSTERECTOMY    . APPENDECTOMY    . CHOLECYSTECTOMY    . COLONOSCOPY WITH PROPOFOL N/A 09/19/2014   Procedure: COLONOSCOPY WITH PROPOFOL;  Surgeon: Manya Silvas, MD;  Location: [facility] ENDOSCOPY;  Service: Endoscopy;  Laterality: N/A;  . DILATION AND CURETTAGE OF UTERUS      Prior to Admission medications   Medication Sig Start Date End Date Taking? Authorizing Provider  aspirin EC 81 MG tablet Take 81 mg by mouth daily.    [provider]  calcium carbonate (OS-CAL) 600 MG TABS tablet Take 600 mg by mouth  2 (two) times daily with a meal.    [provider]  etanercept (ENBREL) 25 MG injection Inject 25 mg into the skin.    [provider]  fluticasone (FLONASE) 50 MCG/ACT nasal spray Place into both nostrils daily.    [provider]  Fluticasone-Salmeterol (ADVAIR) 100-50 MCG/DOSE AEPB Inhale 1 puff into the lungs 2 (two) times daily.    [provider]  folic acid (FOLVITE) 1 MG tablet Take 1 mg by mouth daily.    [provider]  geriatric multivitamins-minerals (ELDERTONIC/GEVRABON) ELIX Take 15 mLs by mouth daily.    [provider]  hydrochlorothiazide (HYDRODIURIL) 25 MG tablet Take 25 mg by mouth daily.    [provider]  levocetirizine (XYZAL) 5 MG tablet Take 5 mg by mouth every evening.    [provider]  lisinopril (PRINIVIL,ZESTRIL) 5 MG tablet Take 5 mg by mouth daily.    [provider]  Melatonin 3 MG CAPS Take by mouth.    [provider]  methotrexate (RHEUMATREX) 2.5 MG tablet Take 7.5 mg by mouth 3 (three) times a week.    [provider]  nabumetone (RELAFEN) 500 MG tablet Take 500 mg by mouth daily.    [provider]  OMEGA-3 FATTY ACIDS PO Take by mouth.    [provider]  pravastatin (PRAVACHOL) 20 MG tablet Take 20 mg by mouth daily.    [provider]  predniSONE (DELTASONE) 2.5 MG tablet  Take 2.5 mg by mouth daily with breakfast.    [provider]    Allergies  Allergen Reactions  . Codeine   . Latex   . Oatmeal   . Penicillins   . Shellfish Allergy   . Sulfa Antibiotics     Family History  Problem Relation Age of Onset  . Breast cancer Neg Hx     Social History Social History   Tobacco Use  . Smoking status: Not on file  Substance Use Topics  . Alcohol use: Not on file  . Drug use: Not on file    Review of Systems Constitutional: Negative for fever. Cardiovascular: Negative for chest pain. Respiratory:  Negative for shortness of breath. Gastrointestinal: Abdominal cramping.  Positive for nausea vomiting 6 days ago, intermittent diarrhea Musculoskeletal: Negative for musculoskeletal complaints Neurological: Negative for headache All other ROS negative  ____________________________________________   PHYSICAL EXAM:  VITAL SIGNS: ED Triage Vitals  Enc Vitals Group     BP 11/28/19 1527 136/73     Pulse Rate 11/28/19 1527 95     Resp 11/28/19 1527 18     Temp 11/28/19 1527 98.6 F (37 C)     Temp Source 11/28/19 1527 Oral     SpO2 11/28/19 1527 100 %     Weight 11/28/19 1528 118 lb (53.5 kg)     Height 11/28/19 1528 4\' 11"  (1.499 m)     Head Circumference --      Peak Flow --      Pain Score 11/28/19 1527 9     Pain Loc --      Pain Edu? --      Excl. in [place]? --    Constitutional: Alert and oriented. Well appearing and in no distress. Eyes: Normal exam ENT      Head: Normocephalic and atraumatic.      Mouth/Throat: Mucous membranes are moist. Cardiovascular: Normal rate, regular rhythm. No murmur Respiratory: Normal respiratory effort without tachypnea nor retractions. Breath sounds are clear  Gastrointestinal: Soft, mild diffuse abdominal tenderness without rebound guarding.  Slight distention. Musculoskeletal: Nontender with normal range of motion in all extremities.  Neurologic:  Normal speech and language. No gross focal neurologic deficits  Skin:  Skin is warm, dry and intact.  Psychiatric: Mood and affect are normal.   ____________________________________________   RADIOLOGY  CT scan consistent with colitis  ____________________________________________   INITIAL IMPRESSION / ASSESSMENT AND PLAN / ED COURSE  Pertinent labs & imaging results that were available during my care of the patient were reviewed by me and considered in my medical decision making (see chart for details).   Patient presents emergency department with complaints of generalized weakness  abnormal lab work.  Patient found to have a creatinine of 6.68, I reviewed the patient's labs in care everywhere and her last creatinine was 0.9 last month indicating acute renal failure.  Anion gap of 17 consistent with dehydration.  Patient does have mildly elevated LFTs as well.  Given the patient's diffuse abdominal tenderness mild leukocytosis we will obtain a CT scan to further evaluate.  We will begin with IV hydration.  Patient will ultimately require admission to the hospital for acute renal failure after emergency department work-up is been completed.  Patient agreeable to plan of care.  CT scan is consistent with colitis.  We will check a C. difficile as a precaution although the patient states no more bowel movements at this time.  We will check blood cultures and start  the patient on IV ciprofloxacin and Flagyl.  We will admit to the hospital service for further treatment and work-up.  Jean Lawson was evaluated in Emergency Department on 11/28/2019 for the symptoms described in the history of present illness. She was evaluated in the context of the global COVID-19 pandemic, which necessitated consideration that the patient might be at risk for infection with the SARS-CoV-2 virus that causes COVID-19. Institutional protocols and algorithms that pertain to the evaluation of patients at risk for COVID-19 are in a state of rapid change based on information released by regulatory bodies including the CDC and federal and state organizations. These policies and algorithms were followed during the patient's care in the ED.  ____________________________________________   FINAL CLINICAL IMPRESSION(S) / ED DIAGNOSES  Acute renal failure Colitis    Harvest Dark, MD 11/28/19 2225

## 2019-11-28 NOTE — ED Notes (Signed)
Pt states she has a procedure done and became "violently" ill after with diarrhea and nausea. Pt states she also feels like she cannot take a deep breath. Pt denies recent covid exposure, but is on an immunosuppresant.

## 2019-11-28 NOTE — ED Notes (Addendum)
Pt states she has no nausea currently. ivf dripping slowly. Pt informed MD is evaluating another pt at this time. Pt states she is "starving" and would like a bowl of cream of mushroom soup. Pt informed that will not be able to provide soup in the emergency department, but if md clears her for PO do have graham crackers and [place] sandwich trays in ED.

## 2019-11-28 NOTE — ED Notes (Signed)
Ed tech in to draw blood cultures.

## 2019-11-29 ENCOUNTER — Encounter: Payer: Self-pay | Admitting: Family Medicine

## 2019-11-29 LAB — COMPREHENSIVE METABOLIC PANEL
ALT: 118 U/L — ABNORMAL HIGH (ref 0–44)
AST: 114 U/L — ABNORMAL HIGH (ref 15–41)
Albumin: 2.5 g/dL — ABNORMAL LOW (ref 3.5–5.0)
Alkaline Phosphatase: 69 U/L (ref 38–126)
Anion gap: 11 (ref 5–15)
BUN: 96 mg/dL — ABNORMAL HIGH (ref 8–23)
CO2: 19 mmol/L — ABNORMAL LOW (ref 22–32)
Calcium: 7.7 mg/dL — ABNORMAL LOW (ref 8.9–10.3)
Chloride: 106 mmol/L (ref 98–111)
Creatinine, Ser: 5.5 mg/dL — ABNORMAL HIGH (ref 0.44–1.00)
GFR calc Af Amer: 8 mL/min — ABNORMAL LOW (ref >60)
GFR calc non Af Amer: 7 mL/min — ABNORMAL LOW (ref >60)
Glucose, Bld: 100 mg/dL — ABNORMAL HIGH (ref 70–99)
Potassium: 4.5 mmol/L (ref 3.5–5.1)
Sodium: 136 mmol/L (ref 135–145)
Total Bilirubin: 1.1 mg/dL (ref 0.3–1.2)
Total Protein: 6 g/dL — ABNORMAL LOW (ref 6.5–8.1)

## 2019-11-29 LAB — CBC
HCT: 32.3 % — ABNORMAL LOW (ref 36.0–46.0)
Hemoglobin: 11.2 g/dL — ABNORMAL LOW (ref 12.0–15.0)
MCH: 31.4 pg (ref 26.0–34.0)
MCHC: 34.7 g/dL (ref 30.0–36.0)
MCV: 90.5 fL (ref 80.0–100.0)
Platelets: 251 10*3/uL (ref 150–400)
RBC: 3.57 MIL/uL — ABNORMAL LOW (ref 3.87–5.11)
RDW: 14.5 % (ref 11.5–15.5)
WBC: 9 10*3/uL (ref 4.0–10.5)
nRBC: 0 % (ref 0.0–0.2)

## 2019-11-29 LAB — URINALYSIS, COMPLETE (UACMP) WITH MICROSCOPIC
Bilirubin Urine: NEGATIVE
Glucose, UA: NEGATIVE mg/dL
Ketones, ur: NEGATIVE mg/dL
Leukocytes,Ua: NEGATIVE
Nitrite: NEGATIVE
Protein, ur: NEGATIVE mg/dL
Specific Gravity, Urine: 1.009 (ref 1.005–1.030)
pH: 5 (ref 5.0–8.0)

## 2019-11-29 LAB — SODIUM, URINE, RANDOM: Sodium, Ur: 48 mmol/L

## 2019-11-29 LAB — CREATININE, URINE, RANDOM: Creatinine, Urine: 42 mg/dL

## 2019-11-29 MED ORDER — MOMETASONE FURO-FORMOTEROL FUM 100-5 MCG/ACT IN AERO
2.0000 | INHALATION_SPRAY | Freq: Two times a day (BID) | RESPIRATORY_TRACT | Status: DC
  Administered 2019-11-29 – 2019-12-01 (×5): 2 via RESPIRATORY_TRACT
  Filled 2019-11-29: qty 8.8

## 2019-11-29 MED ORDER — HEPARIN SODIUM (PORCINE) 5000 UNIT/ML IJ SOLN
5000.0000 [IU] | Freq: Three times a day (TID) | INTRAMUSCULAR | Status: DC
  Administered 2019-11-29 – 2019-12-01 (×6): 5000 [IU] via SUBCUTANEOUS
  Filled 2019-11-29 (×6): qty 1

## 2019-11-29 MED ORDER — MELATONIN 5 MG PO TABS: 5.0000 mg | ORAL_TABLET | Freq: Every evening | ORAL | Status: DC | PRN

## 2019-11-29 MED ORDER — FOLIC ACID 1 MG PO TABS
3.0000 mg | ORAL_TABLET | Freq: Every day | ORAL | Status: DC
  Administered 2019-11-29 – 2019-12-01 (×3): 3 mg via ORAL
  Filled 2019-11-29 (×3): qty 3

## 2019-11-29 MED ORDER — ROSUVASTATIN CALCIUM 10 MG PO TABS
10.0000 mg | ORAL_TABLET | Freq: Every day | ORAL | Status: DC
  Administered 2019-11-29 – 2019-12-01 (×3): 10 mg via ORAL
  Filled 2019-11-29 (×3): qty 1

## 2019-11-29 MED ORDER — ACETAMINOPHEN 325 MG PO TABS
650.0000 mg | ORAL_TABLET | Freq: Four times a day (QID) | ORAL | Status: DC | PRN
  Administered 2019-11-29 – 2019-12-01 (×3): 650 mg via ORAL
  Filled 2019-11-29 (×2): qty 2

## 2019-11-29 MED ORDER — PANTOPRAZOLE SODIUM 40 MG PO TBEC
40.0000 mg | DELAYED_RELEASE_TABLET | Freq: Every day | ORAL | Status: DC
  Administered 2019-11-29 – 2019-12-01 (×3): 40 mg via ORAL
  Filled 2019-11-29 (×3): qty 1

## 2019-11-29 NOTE — ED Notes (Signed)
Pt up to restroom.

## 2019-11-29 NOTE — Progress Notes (Signed)
PROGRESS NOTE    NIOMI Lawson  ZCH:[id number] DOB: February 09, 1946 DOA: 11/28/2019 PCP: Baxter Hire, MD    Assessment & Plan:   Principal Problem:   Acute renal failure (ARF) ([place]) Active Problems:   Colitis   Hyperkalemia   Transaminitis   HTN (hypertension)   Rheumatoid arthritis ([place])   AKI: etiology unclear, likely secondary to vomiting/diarrhea. Continue on IVFs. Cr is trending down from day prior. Nephro following and recs apprec  Colitis: likely due to gastroenteritis. GI PCR panel ordered. Continue on IV cipro, flagyl. Zofran prn for nausea/vomiting. Continue on clear liquid diet    Hyperkalemia: likely secondary to AKI. Resolved  Transaminitis: CT abdomen also shows hepatic steatosis. Will continue to monitor   RA: will hold home dose of etanercept, methotrexate   HTN: will hold home dose of lisinopril, HCTZ secondary to AKI     DVT prophylaxis: heparin  Code Status: DNR Family Communication:  Disposition Plan: likely d/c back home  Status is: Inpatient  Remains inpatient appropriate because:IV treatments appropriate due to intensity of illness or inability to take PO   Dispo: The patient is from: Home              Anticipated d/c is to: Home              Anticipated d/c date is: 3 days              Patient currently is not medically stable to d/c.         Consultants:   nephro   Procedures:   Antimicrobials: cipro, flagyl   Subjective: Pt c/o abd pain  Objective: Vitals:   11/29/19 0108 11/29/19 0230 11/29/19 0508 11/29/19 0800  BP: (!) 120/53 (!) 116/50 (!) 133/57 116/62  Pulse: 81  81 77  Resp: 18  16 16   Temp: 98.6 F (37 C)  98.6 F (37 C) 98.4 F (36.9 C)  TempSrc: Oral  Oral   SpO2: 97%  99% 99%  Weight:      Height:        Intake/Output Summary (Last 24 hours) at 11/29/2019 0824 Last data filed at 11/29/2019 0244 Gross per 24 hour  Intake 2250 ml  Output --  Net 2250 ml   Filed Weights   11/28/19 1528   Weight: 53.5 kg    Examination:  General exam: Appears calm and comfortable  Respiratory system: Clear to auscultation. Respiratory effort normal. Cardiovascular system: S1 & S2 +. No rubs, gallops or clicks.  Gastrointestinal system: Abdomen is nondistended, soft and tender to palpation. Hyperactive bowel sounds heard. Central nervous system: Alert and oriented. Moves all 4 extremities  Psychiatry: Judgement and insight appear normal. Mood & affect appropriate.     Data Reviewed: I have personally reviewed following labs and imaging studies  CBC: Recent Labs  Lab 11/28/19 1914 11/29/19 0606  WBC 11.7* 9.0  HGB 13.8 11.2*  HCT 40.0 32.3*  MCV 89.3 90.5  PLT 332 287   Basic Metabolic Panel: Recent Labs  Lab 11/28/19 1914 11/29/19 0606  NA 133* 136  K 5.3* 4.5  CL 99 106  CO2 17* 19*  GLUCOSE 104* 100*  BUN 103* 96*  CREATININE 6.68* 5.50*  CALCIUM 8.7* 7.7*   GFR: Estimated Creatinine Clearance: 6.6 mL/min (A) (by C-G formula based on SCr of 5.5 mg/dL (H)). Liver Function Tests: Recent Labs  Lab 11/28/19 1914 11/29/19 0606  AST 114* 114*  ALT 103* 118*  ALKPHOS 77 69  BILITOT 1.6* 1.1  PROT 7.4 6.0*  ALBUMIN 3.1* 2.5*   No results for input(s): LIPASE, AMYLASE in the last 168 hours. No results for input(s): AMMONIA in the last 168 hours. Coagulation Profile: No results for input(s): INR, PROTIME in the last 168 hours. Cardiac Enzymes: No results for input(s): CKTOTAL, CKMB, CKMBINDEX, TROPONINI in the last 168 hours. BNP (last 3 results) No results for input(s): PROBNP in the last 8760 hours. HbA1C: No results for input(s): HGBA1C in the last 72 hours. CBG: No results for input(s): GLUCAP in the last 168 hours. Lipid Profile: No results for input(s): CHOL, HDL, LDLCALC, TRIG, CHOLHDL, LDLDIRECT in the last 72 hours. Thyroid Function Tests: No results for input(s): TSH, T4TOTAL, FREET4, T3FREE, THYROIDAB in the last 72 hours. Anemia Panel: No  results for input(s): VITAMINB12, FOLATE, FERRITIN, TIBC, IRON, RETICCTPCT in the last 72 hours. Sepsis Labs: No results for input(s): PROCALCITON, LATICACIDVEN in the last 168 hours.  Recent Results (from the past 240 hour(s))  SARS Coronavirus 2 by RT PCR (hospital order, performed in [facility] hospital lab) Nasopharyngeal Nasopharyngeal Swab     Status: None   Collection Time: 11/28/19  9:26 PM   Specimen: Nasopharyngeal Swab  Result Value Ref Range Status   SARS Coronavirus 2 NEGATIVE NEGATIVE Final    Comment: (NOTE) SARS-CoV-2 target nucleic acids are NOT DETECTED.  The SARS-CoV-2 RNA is generally detectable in upper and lower respiratory specimens during the acute phase of infection. The lowest concentration of SARS-CoV-2 viral copies this assay can detect is 250 copies / mL. A negative result does not preclude SARS-CoV-2 infection and should not be used as the sole basis for treatment or other patient management decisions.  A negative result may occur with improper specimen collection / handling, submission of specimen other than nasopharyngeal swab, presence of viral mutation(s) within the areas targeted by this assay, and inadequate number of viral copies (<250 copies / mL). A negative result must be combined with clinical observations, patient history, and epidemiological information.  Fact Sheet for Patients:   StrictlyIdeas.no  Fact Sheet for Healthcare Providers: BankingDealers.co.za  This test is not yet approved or  cleared by the [place] FDA and has been authorized for detection and/or diagnosis of SARS-CoV-2 by FDA under an Emergency Use Authorization (EUA).  This EUA will remain in effect (meaning this test can be used) for the duration of the COVID-19 declaration under Section 564(b)(1) of the Act, 21 U.S.C. section 360bbb-3(b)(1), unless the authorization is terminated or revoked sooner.  Performed at  [facility], [address] [place], [place] [address]   Blood culture (routine x 2)     Status: None (Preliminary result)   Collection Time: 11/28/19 10:56 PM   Specimen: BLOOD  Result Value Ref Range Status   Specimen Description BLOOD BLOOD RIGHT FOREARM  Final   Special Requests   Final    BOTTLES DRAWN AEROBIC AND ANAEROBIC Blood Culture results may not be optimal due to an excessive volume of blood received in culture bottles   Culture   Final    NO GROWTH < 12 HOURS Performed at [facility], [address] [place],  [address]    Report Status PENDING  Incomplete  Blood culture (routine x 2)     Status: None (Preliminary result)   Collection Time: 11/28/19 10:56 PM   Specimen: BLOOD  Result Value Ref Range Status   Specimen Description BLOOD BLOOD LEFT HAND  Final   Special  Requests   Final    BOTTLES DRAWN AEROBIC AND ANAEROBIC Blood Culture results may not be optimal due to an excessive volume of blood received in culture bottles   Culture   Final    NO GROWTH < 12 HOURS Performed at [facility], [address], [place], [place] [address]    Report Status PENDING  Incomplete         Radiology Studies: CT ABDOMEN PELVIS WO CONTRAST  Result Date: 11/28/2019 CLINICAL DATA:  Nausea and lower abdominal pain. EXAM: CT ABDOMEN AND PELVIS WITHOUT CONTRAST TECHNIQUE: Multidetector CT imaging of the abdomen and pelvis was performed following the standard protocol without IV contrast. COMPARISON:  None. FINDINGS: Lower chest: The lung bases are clear. The heart size is normal. Hepatobiliary: There is decreased hepatic attenuation suggestive of hepatic steatosis. Normal gallbladder.There is no biliary ductal dilation. Pancreas: Normal contours without ductal dilatation. No peripancreatic fluid collection. Spleen: The spleen itself is unremarkable. 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 Given  their size, no further follow-up is necessary. Adrenals/Urinary Tract: --Adrenal glands: Unremarkable. --Right kidney/ureter: No hydronephrosis or radiopaque kidney stones. --Left kidney/ureter: No hydronephrosis or radiopaque kidney stones. --Urinary bladder: There is diffuse bladder wall thickening which may be secondary to underdistention. Stomach/Bowel: --Stomach/Duodenum: No hiatal hernia or other gastric abnormality. Normal duodenal course and caliber. --Small bowel: Unremarkable. --Colon: There is sigmoid diverticulosis. There is diffuse circumferential wall thickening of the descending colon to the level of the sigmoid colon. There is no abscess. No extraluminal free air. --Appendix: Normal. Vascular/Lymphatic: Atherosclerotic calcification is present within the non-aneurysmal abdominal aorta, without hemodynamically significant stenosis. --No retroperitoneal lymphadenopathy. --No mesenteric lymphadenopathy. --No pelvic or inguinal lymphadenopathy. Reproductive: Status post hysterectomy. No adnexal mass. Other: No ascites or free air. The abdominal wall is normal. Musculoskeletal. Multilevel degenerative changes are noted throughout the lumbar spine, greatest at the L4-L5 and L5-S1 levels. IMPRESSION: 1. Diffuse circumferential wall thickening of the descending colon to the level of the sigmoid colon, consistent with infectious or inflammatory colitis. 2. Sigmoid diverticulosis without CT evidence for diverticulitis. 3. Hepatic steatosis. Aortic Atherosclerosis (ICD10-I70.0). Electronically Signed   By: Constance Holster M.D.   On: 11/28/2019 21:22        Scheduled Meds: . alum & mag hydroxide-simeth  15 mL Oral Once  . folic acid  3 mg Oral Daily  . heparin  5,000 Units Subcutaneous Q8H  . mometasone-formoterol  2 puff Inhalation BID  . pantoprazole  40 mg Oral Daily  . rosuvastatin  10 mg Oral Daily   Continuous Infusions: . ciprofloxacin Stopped (11/29/19 0004)  . ciprofloxacin    .  lactated ringers 75 mL/hr at 11/29/19 0244  . metronidazole 500 mg (11/29/19 0645)     LOS: 1 day    Time spent: 34 mins     Wyvonnia Dusky, MD [facility] Pager [phone]  If 7PM-7AM, please contact night-coverage [email or web address] 11/29/2019, 8:24 AM

## 2019-11-29 NOTE — Consult Note (Signed)
[facility]  CONSULT NOTE    Date: 11/29/2019                  Patient Name:  ALEILA SYVERSON  MRN: [id number]  DOB: Mar 17, 1945  Age / Sex: 74 y.o., female         PCP: Baxter Hire, MD                 Service Requesting Consult: Dr. Jimmye Norman                 Reason for Consult: Acute renal failure            History of Present Illness: Ms. MARIYAH UPSHAW went for cataract extraction on 9/17. Since then, she has been having nausea, vomiting, diarrhea, abdominal pain and poor PO intake. She was continuing to take her medications.  Presented to [facility] clinic yesterday and asked to go to the ED where patient was found to have acute renal failure and started on IV fluids. Patient then found to have acute diverticulitis and started on ciprofloxacin and metronidazole.   Medications: Outpatient medications: Medications Prior to Admission  Medication Sig Dispense Refill Last Dose  . Calcium Carb-Cholecalciferol (CALCIUM 600 + D PO) Take 1 tablet by mouth 2 (two) times daily.   11/28/2019 at Unknown time  . etanercept (ENBREL) 50 MG/ML injection Inject 50 mg into the skin every Saturday.    Past Month at Unknown time  . Fluticasone-Salmeterol (ADVAIR) 100-50 MCG/DOSE AEPB Inhale 1 puff into the lungs 2 (two) times daily.   11/28/2019 at Unknown time  . folic acid (FOLVITE) 1 MG tablet Take 3 mg by mouth daily.    11/28/2019 at Unknown time  . hydrochlorothiazide (HYDRODIURIL) 25 MG tablet Take 25 mg by mouth daily.   11/28/2019 at Unknown time  . levocetirizine (XYZAL) 5 MG tablet Take 5 mg by mouth every evening.   11/27/2019 at Unknown time  . lisinopril (PRINIVIL,ZESTRIL) 5 MG tablet Take 5 mg by mouth daily.   11/28/2019 at Unknown time  . Melatonin 3 MG CAPS Take 3 mg by mouth at bedtime as needed (sleep).    prn at prn  . methotrexate (RHEUMATREX) 2.5 MG tablet Take 7.5 mg by mouth every Monday.    Past Week at Unknown time  . Multiple Vitamin (MULTIVITAMIN WITH  MINERALS) TABS tablet Take 1 tablet by mouth daily.   11/28/2019 at Unknown time  . nabumetone (RELAFEN) 500 MG tablet Take 500 mg by mouth daily.   11/28/2019 at Unknown time  . OMEGA-3 FATTY ACIDS PO Take 1 capsule by mouth 2 (two) times daily.    11/28/2019 at Unknown time  . omeprazole (PRILOSEC) 20 MG capsule Take 20 mg by mouth as directed. Six days per week. Hold dose on Fridays   11/28/2019 at Unknown time  . ondansetron (ZOFRAN-ODT) 4 MG disintegrating tablet Take 4 mg by mouth every 8 (eight) hours as needed for nausea or vomiting.   prn at prn  . Prednisolon-Moxiflox-Bromfenac 1-0.5-0.075 % SOLN Apply 1 drop to eye 2 (two) times daily.   11/28/2019 at Unknown time  . risedronate (ACTONEL) 35 MG tablet Take 35 mg by mouth every 7 (seven) days. with water on empty stomach, nothing by mouth or lie down for next 30 minutes.   Past Week at Unknown time  . rosuvastatin (CRESTOR) 10 MG tablet Take 10 mg by mouth daily.   11/28/2019 at Unknown time  Current medications: Current Facility-Administered Medications  Medication Dose Route Frequency Provider Last Rate Last Admin  . acetaminophen (TYLENOL) tablet 650 mg  650 mg Oral Q6H PRN Sharion Settler, NP   650 mg at 11/29/19 0916  . alum & mag hydroxide-simeth (MAALOX/MYLANTA) 200-200-20 MG/5ML suspension 15 mL  15 mL Oral Once Tu, Ching T, DO      . ciprofloxacin (CIPRO) IVPB 400 mg  400 mg Intravenous Once Harvest Dark, MD   Stopped at 11/29/19 0004  . ciprofloxacin (CIPRO) IVPB 400 mg  400 mg Intravenous Q24H Orene Desanctis, RPH      . dicyclomine (BENTYL) tablet 20 mg  20 mg Oral Q6H PRN Tu, Ching T, DO      . folic acid (FOLVITE) tablet 3 mg  3 mg Oral Daily Tu, Ching T, DO   3 mg at 11/29/19 0910  . heparin injection 5,000 Units  5,000 Units Subcutaneous Q8H Tu, Ching T, DO      . lactated ringers infusion   Intravenous Continuous Tu, Ching T, DO 75 mL/hr at 11/29/19 0244 New Bag at 11/29/19 0244  . melatonin tablet 5 mg  5 mg Oral  QHS PRN Tu, Ching T, DO      . metroNIDAZOLE (FLAGYL) IVPB 500 mg  500 mg Intravenous Q8H Tu, Ching T, DO 100 mL/hr at 11/29/19 0645 500 mg at 11/29/19 0645  . mometasone-formoterol (DULERA) 100-5 MCG/ACT inhaler 2 puff  2 puff Inhalation BID Tu, Ching T, DO   2 puff at 11/29/19 0910  . ondansetron (ZOFRAN) injection 4 mg  4 mg Intravenous Q6H PRN Tu, Ching T, DO      . pantoprazole (PROTONIX) EC tablet 40 mg  40 mg Oral Daily Tu, Ching T, DO   40 mg at 11/29/19 0910  . rosuvastatin (CRESTOR) tablet 10 mg  10 mg Oral Daily Tu, Ching T, DO   10 mg at 11/29/19 2841      Allergies: Allergies  Allergen Reactions  . Codeine   . Latex   . Oatmeal   . Penicillins   . Shellfish Allergy   . Sulfa Antibiotics       Past Medical History: Past Medical History:  Diagnosis Date  . Arthritis   . Asthma   . Headache   . Hypertension   . Hypothyroidism      Past Surgical History: Past Surgical History:  Procedure Laterality Date  . ABDOMINAL HYSTERECTOMY    . APPENDECTOMY    . CHOLECYSTECTOMY    . COLONOSCOPY WITH PROPOFOL N/A 09/19/2014   Procedure: COLONOSCOPY WITH PROPOFOL;  Surgeon: Manya Silvas, MD;  Location: [facility] ENDOSCOPY;  Service: Endoscopy;  Laterality: N/A;  . DILATION AND CURETTAGE OF UTERUS       Family History: Family History  Problem Relation Age of Onset  . Breast cancer Neg Hx      Social History: Social History   Socioeconomic History  . Marital status: Married    Spouse name: Not on file  . Number of children: Not on file  . Years of education: Not on file  . Highest education level: Not on file  Occupational History  . Not on file  Tobacco Use  . Smoking status: Never Smoker  . Smokeless tobacco: Never Used  Substance and Sexual Activity  . Alcohol use: Not on file  . Drug use: Not on file  . Sexual activity: Not on file  Other Topics Concern  . Not on file  Social History  Narrative  . Not on file   Social Determinants of Health    Financial Resource Strain:   . Difficulty of Paying Living Expenses: Not on file  Food Insecurity:   . Worried About Charity fundraiser in the Last Year: Not on file  . Ran Out of Food in the Last Year: Not on file  Transportation Needs:   . Lack of Transportation (Medical): Not on file  . Lack of Transportation (Non-Medical): Not on file  Physical Activity:   . Days of Exercise per Week: Not on file  . Minutes of Exercise per Session: Not on file  Stress:   . Feeling of Stress : Not on file  Social Connections:   . Frequency of Communication with Friends and Family: Not on file  . Frequency of Social Gatherings with Friends and Family: Not on file  . Attends Religious Services: Not on file  . Active Member of Clubs or Organizations: Not on file  . Attends Archivist Meetings: Not on file  . Marital Status: Not on file  Intimate Partner Violence:   . Fear of Current or Ex-Partner: Not on file  . Emotionally Abused: Not on file  . Physically Abused: Not on file  . Sexually Abused: Not on file     Review of Systems: Review of Systems  Constitutional: Negative.   HENT: Negative.   Eyes: Negative.   Respiratory: Positive for sputum production. Negative for cough, hemoptysis, shortness of breath and wheezing.   Cardiovascular: Negative.  Negative for chest pain, palpitations, orthopnea, claudication, leg swelling and PND.  Gastrointestinal: Positive for abdominal pain, diarrhea, nausea and vomiting.  Genitourinary: Negative for dysuria, flank pain, frequency, hematuria and urgency.  Musculoskeletal: Negative for back pain, falls, joint pain, myalgias and neck pain.  Skin: Negative.   Neurological: Negative.   Endo/Heme/Allergies: Negative.   Psychiatric/Behavioral: Negative.     Vital Signs: Blood pressure 116/62, pulse 77, temperature 98.4 F (36.9 C), resp. rate 16, height 4\' 11"  (1.499 m), weight 53.5 kg, SpO2 99 %.  Weight trends: Filed Weights    11/28/19 1528  Weight: 53.5 kg    Physical Exam: General: NAD, laying in bed  Head: Normocephalic, atraumatic. Dry oral mucosal membranes  Eyes: Anicteric, PERRL  Neck: Supple, trachea midline  Lungs:  Clear to auscultation  Heart: Regular rate and rhythm  Abdomen:  +tender to palpation left lower quadrant  Extremities:  no peripheral edema.  Neurologic: Nonfocal, moving all four extremities  Skin: No lesions        Lab results: Basic Metabolic Panel: Recent Labs  Lab 11/28/19 1914 11/29/19 0606  NA 133* 136  K 5.3* 4.5  CL 99 106  CO2 17* 19*  GLUCOSE 104* 100*  BUN 103* 96*  CREATININE 6.68* 5.50*  CALCIUM 8.7* 7.7*    Liver Function Tests: Recent Labs  Lab 11/28/19 1914 11/29/19 0606  AST 114* 114*  ALT 103* 118*  ALKPHOS 77 69  BILITOT 1.6* 1.1  PROT 7.4 6.0*  ALBUMIN 3.1* 2.5*   No results for input(s): LIPASE, AMYLASE in the last 168 hours. No results for input(s): AMMONIA in the last 168 hours.  CBC: Recent Labs  Lab 11/28/19 1914 11/29/19 0606  WBC 11.7* 9.0  HGB 13.8 11.2*  HCT 40.0 32.3*  MCV 89.3 90.5  PLT 332 251    Cardiac Enzymes: No results for input(s): CKTOTAL, CKMB, CKMBINDEX, TROPONINI in the last 168 hours.  BNP: Invalid input(s): POCBNP  CBG: No  results for input(s): GLUCAP in the last 168 hours.  Microbiology: Results for orders placed or performed during the hospital encounter of 11/28/19  SARS Coronavirus 2 by RT PCR (hospital order, performed in [facility] hospital lab) Nasopharyngeal Nasopharyngeal Swab     Status: None   Collection Time: 11/28/19  9:26 PM   Specimen: Nasopharyngeal Swab  Result Value Ref Range Status   SARS Coronavirus 2 NEGATIVE NEGATIVE Final    Comment: (NOTE) SARS-CoV-2 target nucleic acids are NOT DETECTED.  The SARS-CoV-2 RNA is generally detectable in upper and lower respiratory specimens during the acute phase of infection. The lowest concentration of SARS-CoV-2 viral copies this  assay can detect is 250 copies / mL. A negative result does not preclude SARS-CoV-2 infection and should not be used as the sole basis for treatment or other patient management decisions.  A negative result may occur with improper specimen collection / handling, submission of specimen other than nasopharyngeal swab, presence of viral mutation(s) within the areas targeted by this assay, and inadequate number of viral copies (<250 copies / mL). A negative result must be combined with clinical observations, patient history, and epidemiological information.  Fact Sheet for Patients:   StrictlyIdeas.no  Fact Sheet for Healthcare Providers: BankingDealers.co.za  This test is not yet approved or  cleared by the [place] FDA and has been authorized for detection and/or diagnosis of SARS-CoV-2 by FDA under an Emergency Use Authorization (EUA).  This EUA will remain in effect (meaning this test can be used) for the duration of the COVID-19 declaration under Section 564(b)(1) of the Act, 21 U.S.C. section 360bbb-3(b)(1), unless the authorization is terminated or revoked sooner.  Performed at [facility], [address] [place], [place] [address]   Blood culture (routine x 2)     Status: None (Preliminary result)   Collection Time: 11/28/19 10:56 PM   Specimen: BLOOD  Result Value Ref Range Status   Specimen Description BLOOD BLOOD RIGHT FOREARM  Final   Special Requests   Final    BOTTLES DRAWN AEROBIC AND ANAEROBIC Blood Culture results may not be optimal due to an excessive volume of blood received in culture bottles   Culture   Final    NO GROWTH < 12 HOURS Performed at [facility], [address], [place], [place] [address]    Report Status PENDING  Incomplete  Blood culture (routine x 2)     Status: None (Preliminary result)   Collection Time: 11/28/19 10:56 PM   Specimen: BLOOD  Result Value Ref Range  Status   Specimen Description BLOOD BLOOD LEFT HAND  Final   Special Requests   Final    BOTTLES DRAWN AEROBIC AND ANAEROBIC Blood Culture results may not be optimal due to an excessive volume of blood received in culture bottles   Culture   Final    NO GROWTH < 12 HOURS Performed at [facility], [address] [place], [place] [address]    Report Status PENDING  Incomplete    Coagulation Studies: No results for input(s): LABPROT, INR in the last 72 hours.  Urinalysis: Recent Labs    11/29/19 0300  COLORURINE YELLOW*  LABSPEC 1.009  PHURINE 5.0  GLUCOSEU NEGATIVE  HGBUR SMALL*  BILIRUBINUR NEGATIVE  KETONESUR NEGATIVE  PROTEINUR NEGATIVE  NITRITE NEGATIVE  LEUKOCYTESUR NEGATIVE      Imaging: CT ABDOMEN PELVIS WO CONTRAST  Result Date: 11/28/2019 CLINICAL DATA:  Nausea and lower abdominal pain. EXAM: CT ABDOMEN AND PELVIS  WITHOUT CONTRAST TECHNIQUE: Multidetector CT imaging of the abdomen and pelvis was performed following the standard protocol without IV contrast. COMPARISON:  None. FINDINGS: Lower chest: The lung bases are clear. The heart size is normal. Hepatobiliary: There is decreased hepatic attenuation suggestive of hepatic steatosis. Normal gallbladder.There is no biliary ductal dilation. Pancreas: Normal contours without ductal dilatation. No peripancreatic fluid collection. Spleen: The spleen itself is unremarkable. 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 Given their size, no further follow-up is necessary. Adrenals/Urinary Tract: --Adrenal glands: Unremarkable. --Right kidney/ureter: No hydronephrosis or radiopaque kidney stones. --Left kidney/ureter: No hydronephrosis or radiopaque kidney stones. --Urinary bladder: There is diffuse bladder wall thickening which may be secondary to underdistention. Stomach/Bowel: --Stomach/Duodenum: No hiatal hernia or other gastric abnormality. Normal duodenal course and  caliber. --Small bowel: Unremarkable. --Colon: There is sigmoid diverticulosis. There is diffuse circumferential wall thickening of the descending colon to the level of the sigmoid colon. There is no abscess. No extraluminal free air. --Appendix: Normal. Vascular/Lymphatic: Atherosclerotic calcification is present within the non-aneurysmal abdominal aorta, without hemodynamically significant stenosis. --No retroperitoneal lymphadenopathy. --No mesenteric lymphadenopathy. --No pelvic or inguinal lymphadenopathy. Reproductive: Status post hysterectomy. No adnexal mass. Other: No ascites or free air. The abdominal wall is normal. Musculoskeletal. Multilevel degenerative changes are noted throughout the lumbar spine, greatest at the L4-L5 and L5-S1 levels. IMPRESSION: 1. Diffuse circumferential wall thickening of the descending colon to the level of the sigmoid colon, consistent with infectious or inflammatory colitis. 2. Sigmoid diverticulosis without CT evidence for diverticulitis. 3. Hepatic steatosis. Aortic Atherosclerosis (ICD10-I70.0). Electronically Signed   By: Constance Holster M.D.   On: 11/28/2019 21:22      Assessment & Plan: Ms. HOLLYNN GARNO is a 74 y.o. white female with hypertension, hyperlipidemia, GERD, asthma, rheumatoid arthritis , who was admitted to [facility] on 11/28/2019 for Colitis [K52.9] Acute renal failure (ARF) ([place]) [N17.9] Acute renal failure, unspecified acute renal failure type ([place]) [N17.9] Diarrhea, unspecified type [R19.7] Nausea and vomiting, intractability of vomiting not specified, unspecified vomiting type [R11.2]  1. Acute renal failure  2. Hyperkalemia 3. Hyponatremia 4. Metabolic acidosis 5. Hypotension  Secondary to prerenal azotemia from acute colitis, GI losses, poor PO intake and continuation of hydrochlorothiazide and lisinopril Baseline creatinine of 0.9, GFR of 63 on 10/16/2019 - Holding lisinopril and hydrochlorothiazide.  - IV fluids: LR at 68mL/hr -  Encourage PO intake - No acute indication for dialysis   LOS: 1 Anora Schwenke 9/23/202112:32 PM

## 2019-11-29 NOTE — ED Notes (Signed)
Pt up to restroom to void.  

## 2019-11-29 NOTE — ED Notes (Signed)
approx 250 ml of ns left in second bolus bag, will hand LR after bolus complete.

## 2019-11-30 LAB — CBC
HCT: 29.3 % — ABNORMAL LOW (ref 36.0–46.0)
Hemoglobin: 10.1 g/dL — ABNORMAL LOW (ref 12.0–15.0)
MCH: 31.1 pg (ref 26.0–34.0)
MCHC: 34.5 g/dL (ref 30.0–36.0)
MCV: 90.2 fL (ref 80.0–100.0)
Platelets: 260 10*3/uL (ref 150–400)
RBC: 3.25 MIL/uL — ABNORMAL LOW (ref 3.87–5.11)
RDW: 14.4 % (ref 11.5–15.5)
WBC: 3.9 10*3/uL — ABNORMAL LOW (ref 4.0–10.5)
nRBC: 0 % (ref 0.0–0.2)

## 2019-11-30 LAB — COMPREHENSIVE METABOLIC PANEL
ALT: 133 U/L — ABNORMAL HIGH (ref 0–44)
AST: 83 U/L — ABNORMAL HIGH (ref 15–41)
Albumin: 2.1 g/dL — ABNORMAL LOW (ref 3.5–5.0)
Alkaline Phosphatase: 61 U/L (ref 38–126)
Anion gap: 7 (ref 5–15)
BUN: 68 mg/dL — ABNORMAL HIGH (ref 8–23)
CO2: 23 mmol/L (ref 22–32)
Calcium: 7.1 mg/dL — ABNORMAL LOW (ref 8.9–10.3)
Chloride: 107 mmol/L (ref 98–111)
Creatinine, Ser: 3.91 mg/dL — ABNORMAL HIGH (ref 0.44–1.00)
GFR calc Af Amer: 12 mL/min — ABNORMAL LOW (ref >60)
GFR calc non Af Amer: 11 mL/min — ABNORMAL LOW (ref >60)
Glucose, Bld: 86 mg/dL (ref 70–99)
Potassium: 3.9 mmol/L (ref 3.5–5.1)
Sodium: 137 mmol/L (ref 135–145)
Total Bilirubin: 1.1 mg/dL (ref 0.3–1.2)
Total Protein: 5.4 g/dL — ABNORMAL LOW (ref 6.5–8.1)

## 2019-11-30 NOTE — Progress Notes (Signed)
Pharmacy Antibiotic Note  Jean Lawson is a 74 y.o. female admitted on 11/28/2019 with colitis.  Pharmacy has been consulted for ciprofloxacin dosing.  Patient has current acute renal failure. CrCl <57ml/min   Plan: Continue ciprofloxacin 400 mg IV Q24H   Height: 4\' 11"  (149.9 cm) Weight: 53.5 kg (118 lb) IBW/kg (Calculated) : 43.2  Temp (24hrs), Avg:98.4 F (36.9 C), Min:98 F (36.7 C), Max:99 F (37.2 C)  Recent Labs  Lab 11/28/19 1914 11/29/19 0606 11/30/19 0539  WBC 11.7* 9.0 3.9*  CREATININE 6.68* 5.50* 3.91*    Estimated Creatinine Clearance: 9.3 mL/min (A) (by C-G formula based on SCr of 3.91 mg/dL (H)).    Allergies  Allergen Reactions  . Codeine   . Latex   . Oatmeal   . Penicillins   . Shellfish Allergy   . Sulfa Antibiotics     Antimicrobials this admission:  9/23 Cipro >>   9/23 Flagyl >>   Microbiology results:  BCx: NG TD  GI PCR: Pending   Thank you for allowing pharmacy to be a part of this patient's care.  Pernell Dupre, PharmD, BCPS Clinical Pharmacist 11/30/2019 11:00 AM

## 2019-11-30 NOTE — Evaluation (Signed)
Physical Therapy Evaluation Patient Details Name: Jean Lawson MRN: [id number] DOB: 04/02/1945 Today's Date: 11/30/2019   History of Present Illness  Jean Lawson is a 74 y.o. female with medical history significant for rheumatoid arthritis on immunosuppressive's, hypertension, GERD and hyperlipidemia who presented at the advice of her primary physician for acute renal failure. MD assessment includes Acute renal failure and hypotension  Clinical Impression  Pt was pleasant and motivated to participate during the session. Pt reports feeling significantly weaker than her normal self but was eager to get out of bed. Pt was educated on HEP and bed level exercises that pt could perform in bed. Pt able to complete exercises with only mild fatigue. Pt was able to come from supine to sit without therapist support and extra time but the movement was significantly fatiguing. Pt's SpO2 and HR were WNL. Pt was able to perform STS with MinA. Pt was able to ambulate 20' with min guard and RW. Pt was stable while walking but demonstrated decreased gait velocity and fatigued quickly. Pt required MinA to get legs back into bed. Pt able to perform bridging to scoot themself in bed and place a chuckpad under them. Pt will benefit from HHPT services upon discharge to safely address deficits listed in patient problem list for decreased caregiver assistance and eventual return to PLOF.     Follow Up Recommendations Home health PT    Equipment Recommendations  Rolling walker with 5" wheels    Recommendations for Other Services       Precautions / Restrictions Precautions Precautions: None Restrictions Weight Bearing Restrictions: No      Mobility  Bed Mobility Overal bed mobility: Needs Assistance Bed Mobility: Supine to Sit;Sit to Supine     Supine to sit: Supervision Sit to supine: Min assist   General bed mobility comments: pt able to complete Supine to Sit SBA but with greatly increased effort.  pt signifigantly fatigued after  Transfers Overall transfer level: Needs assistance Equipment used: Rolling walker (2 wheeled) Transfers: Sit to/from Stand Sit to Stand: Min assist         General transfer comment: MinA to come fully to standing. pt stable once on their feet  Ambulation/Gait Ambulation/Gait assistance: Min guard Gait Distance (Feet): 20 Feet Assistive device: Rolling walker (2 wheeled) Gait Pattern/deviations: Step-through pattern;Decreased stride length Gait velocity: decreased   General Gait Details: Gait verlocity greatly decreased with decreased cadence and stride length. No LOB and pt walks with good gait mechanics  Stairs            Wheelchair Mobility    Modified Rankin (Stroke Patients Only)       Balance Overall balance assessment: Needs assistance Sitting-balance support: No upper extremity supported;Feet unsupported Sitting balance-Leahy Scale: Good Sitting balance - Comments: pt able to sit and accept some challange to balance with bed changing posistions.   Standing balance support: Bilateral upper extremity supported Standing balance-Leahy Scale: Good Standing balance comment: pt demonstrates stable standing balance but quickly fatigues and needs to sit. No greatly increased postural sway                             Pertinent Vitals/Pain Pain Assessment: Faces Faces Pain Scale: Hurts a little bit Pain Location: abdomen  Pain Descriptors / Indicators: Grimacing Pain Intervention(s): Limited activity within patient's tolerance;Monitored during session    Home Living Family/patient expects to be discharged to:: Private residence Living Arrangements: Spouse/significant other  Available Help at Discharge: Family;Available 24 hours/day Type of Home: Independent living facility Home Access: Level entry     Home Layout: Two level;Able to live on main level with bedroom/bathroom Home Equipment: None;Walker - standard;Cane  - single point Additional Comments: Pt reports does not go upstairs - husbands office. pt reports having a callbell to call [facility]. Has standard walker from foot surgery. does not use AD at baseline    Prior Function Level of Independence: Independent         Comments: Baseline walks 2 miles/day with husband. Residents of [place]. Reports husband asssisting c LBD ~last week 2/2 recent cataract surgery precaustions limiting bending over     Hand Dominance        Extremity/Trunk Assessment       Lower Extremity Assessment Lower Extremity Assessment: Generalized weakness     Communication   Communication: No difficulties  Cognition Arousal/Alertness: Awake/alert Behavior During Therapy: WFL for tasks assessed/performed Overall Cognitive Status: Within Functional Limits for tasks assessed                                        General Comments      Exercises Total Joint Exercises Ankle Circles/Pumps: AROM;Both;10 reps Quad Sets: AROM;Both;10 reps Gluteal Sets: AROM;Both;10 reps Heel Slides: AROM;Right Hip ABduction/ADduction: AROM;Right Straight Leg Raises: AROM;Right Long Arc Quad: AROM;Both;10 reps Bridges: AROM;5 reps Other Exercises Other Exercises: pt educated on HEP to be performed while in hospital to prevent deconditioning    Assessment/Plan    PT Assessment Patient needs continued PT services  PT Problem List Decreased strength;Decreased mobility;Decreased activity tolerance;Decreased balance       PT Treatment Interventions DME instruction;Therapeutic exercise;Gait training;Balance training;Stair training;Neuromuscular re-education;Functional mobility training;Therapeutic activities    PT Goals (Current goals can be found in the Care Plan section)  Acute Rehab PT Goals Patient Stated Goal: to get stronger PT Goal Formulation: With patient Time For Goal Achievement: 12/12/19 Potential to Achieve Goals: Good     Frequency Min 2X/week   Barriers to discharge        Co-evaluation               AM-PAC PT "6 Clicks" Mobility  Outcome Measure Help needed turning from your back to your side while in a flat bed without using bedrails?: A Little Help needed moving from lying on your back to sitting on the side of a flat bed without using bedrails?: A Little Help needed moving to and from a bed to a chair (including a wheelchair)?: A Little Help needed standing up from a chair using your arms (e.g., wheelchair or bedside chair)?: A Little Help needed to walk in hospital room?: A Lot Help needed climbing 3-5 steps with a railing? : A Lot 6 Click Score: 16    End of Session Equipment Utilized During Treatment: Gait belt Activity Tolerance: Patient limited by fatigue Patient left: in bed;with call bell/phone within reach;with bed alarm set Nurse Communication: Mobility status PT Visit Diagnosis: Unsteadiness on feet (R26.81);Muscle weakness (generalized) (M62.81)    Time: 7169-6789 PT Time Calculation (min) (ACUTE ONLY): 38 min   Charges:              Hervey Ard, SPT 11/30/19. 4:53 PM

## 2019-11-30 NOTE — Progress Notes (Signed)
[facility]  ROUNDING NOTE   Subjective:   States her diarrhea has decreased, less abdominal pain.  LR infusion at 17mL/hr  cipro and metronidazole  Objective:  Vital signs in last 24 hours:  Temp:  [98 F (36.7 C)-99 F (37.2 C)] 98 F (36.7 C) (09/24 0804) Pulse Rate:  [59-77] 59 (09/24 0804) Resp:  [16-20] 20 (09/24 0804) BP: (101-123)/(49-53) 123/50 (09/24 0804) SpO2:  [96 %-98 %] 98 % (09/24 0804)  Weight change:  Filed Weights   11/28/19 1528  Weight: 53.5 kg    Intake/Output: I/O last 3 completed shifts: In: 2810.3 [P.O.:240; I.V.:151.4; IV Piggyback:2418.9] Out: 225 [Urine:225]   Intake/Output this shift:  No intake/output data recorded.  Physical Exam: General: NAD,   Head: Normocephalic, atraumatic. Moist oral mucosal membranes  Eyes: Anicteric, PERRL  Neck: Supple, trachea midline  Lungs:  Clear to auscultation  Heart: Regular rate and rhythm  Abdomen:  Left lower quadrant tenderness  Extremities: no peripheral edema.  Neurologic: Nonfocal, moving all four extremities  Skin: No lesions        Basic Metabolic Panel: Recent Labs  Lab 11/28/19 1914 11/29/19 0606 11/30/19 0539  NA 133* 136 137  K 5.3* 4.5 3.9  CL 99 106 107  CO2 17* 19* 23  GLUCOSE 104* 100* 86  BUN 103* 96* 68*  CREATININE 6.68* 5.50* 3.91*  CALCIUM 8.7* 7.7* 7.1*    Liver Function Tests: Recent Labs  Lab 11/28/19 1914 11/29/19 0606 11/30/19 0539  AST 114* 114* 83*  ALT 103* 118* 133*  ALKPHOS 77 69 61  BILITOT 1.6* 1.1 1.1  PROT 7.4 6.0* 5.4*  ALBUMIN 3.1* 2.5* 2.1*   No results for input(s): LIPASE, AMYLASE in the last 168 hours. No results for input(s): AMMONIA in the last 168 hours.  CBC: Recent Labs  Lab 11/28/19 1914 11/29/19 0606 11/30/19 0539  WBC 11.7* 9.0 3.9*  HGB 13.8 11.2* 10.1*  HCT 40.0 32.3* 29.3*  MCV 89.3 90.5 90.2  PLT 332 251 260    Cardiac Enzymes: No results for input(s): CKTOTAL, CKMB, CKMBINDEX, TROPONINI in  the last 168 hours.  BNP: Invalid input(s): POCBNP  CBG: No results for input(s): GLUCAP in the last 168 hours.  Microbiology: Results for orders placed or performed during the hospital encounter of 11/28/19  SARS Coronavirus 2 by RT PCR (hospital order, performed in [facility] hospital lab) Nasopharyngeal Nasopharyngeal Swab     Status: None   Collection Time: 11/28/19  9:26 PM   Specimen: Nasopharyngeal Swab  Result Value Ref Range Status   SARS Coronavirus 2 NEGATIVE NEGATIVE Final    Comment: (NOTE) SARS-CoV-2 target nucleic acids are NOT DETECTED.  The SARS-CoV-2 RNA is generally detectable in upper and lower respiratory specimens during the acute phase of infection. The lowest concentration of SARS-CoV-2 viral copies this assay can detect is 250 copies / mL. A negative result does not preclude SARS-CoV-2 infection and should not be used as the sole basis for treatment or other patient management decisions.  A negative result may occur with improper specimen collection / handling, submission of specimen other than nasopharyngeal swab, presence of viral mutation(s) within the areas targeted by this assay, and inadequate number of viral copies (<250 copies / mL). A negative result must be combined with clinical observations, patient history, and epidemiological information.  Fact Sheet for Patients:   StrictlyIdeas.no  Fact Sheet for Healthcare Providers: BankingDealers.co.za  This test is not yet approved or  cleared by the [place]  States FDA and has been authorized for detection and/or diagnosis of SARS-CoV-2 by FDA under an Emergency Use Authorization (EUA).  This EUA will remain in effect (meaning this test can be used) for the duration of the COVID-19 declaration under Section 564(b)(1) of the Act, 21 U.S.C. section 360bbb-3(b)(1), unless the authorization is terminated or revoked sooner.  Performed at [facility], [address] [place], [place] [address]   Blood culture (routine x 2)     Status: None (Preliminary result)   Collection Time: 11/28/19 10:56 PM   Specimen: BLOOD  Result Value Ref Range Status   Specimen Description BLOOD BLOOD RIGHT FOREARM  Final   Special Requests   Final    BOTTLES DRAWN AEROBIC AND ANAEROBIC Blood Culture results may not be optimal due to an excessive volume of blood received in culture bottles   Culture   Final    NO GROWTH 2 DAYS Performed at [facility], [address], [place], [place] [address]    Report Status PENDING  Incomplete  Blood culture (routine x 2)     Status: None (Preliminary result)   Collection Time: 11/28/19 10:56 PM   Specimen: BLOOD  Result Value Ref Range Status   Specimen Description BLOOD BLOOD LEFT HAND  Final   Special Requests   Final    BOTTLES DRAWN AEROBIC AND ANAEROBIC Blood Culture results may not be optimal due to an excessive volume of blood received in culture bottles   Culture   Final    NO GROWTH 2 DAYS Performed at [facility], [address] [place], [place] [address]    Report Status PENDING  Incomplete    Coagulation Studies: No results for input(s): LABPROT, INR in the last 72 hours.  Urinalysis: Recent Labs    11/29/19 0300  COLORURINE YELLOW*  LABSPEC 1.009  PHURINE 5.0  GLUCOSEU NEGATIVE  HGBUR SMALL*  BILIRUBINUR NEGATIVE  KETONESUR NEGATIVE  PROTEINUR NEGATIVE  NITRITE NEGATIVE  LEUKOCYTESUR NEGATIVE      Imaging: CT ABDOMEN PELVIS WO CONTRAST  Result Date: 11/28/2019 CLINICAL DATA:  Nausea and lower abdominal pain. EXAM: CT ABDOMEN AND PELVIS WITHOUT CONTRAST TECHNIQUE: Multidetector CT imaging of the abdomen and pelvis was performed following the standard protocol without IV contrast. COMPARISON:  None. FINDINGS: Lower chest: The lung bases are clear. The heart size is normal. Hepatobiliary: There is decreased hepatic attenuation suggestive of hepatic  steatosis. Normal gallbladder.There is no biliary ductal dilation. Pancreas: Normal contours without ductal dilatation. No peripancreatic fluid collection. Spleen: The spleen itself is unremarkable. 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 Given their size, no further follow-up is necessary. Adrenals/Urinary Tract: --Adrenal glands: Unremarkable. --Right kidney/ureter: No hydronephrosis or radiopaque kidney stones. --Left kidney/ureter: No hydronephrosis or radiopaque kidney stones. --Urinary bladder: There is diffuse bladder wall thickening which may be secondary to underdistention. Stomach/Bowel: --Stomach/Duodenum: No hiatal hernia or other gastric abnormality. Normal duodenal course and caliber. --Small bowel: Unremarkable. --Colon: There is sigmoid diverticulosis. There is diffuse circumferential wall thickening of the descending colon to the level of the sigmoid colon. There is no abscess. No extraluminal free air. --Appendix: Normal. Vascular/Lymphatic: Atherosclerotic calcification is present within the non-aneurysmal abdominal aorta, without hemodynamically significant stenosis. --No retroperitoneal lymphadenopathy. --No mesenteric lymphadenopathy. --No pelvic or inguinal lymphadenopathy. Reproductive: Status post hysterectomy. No adnexal mass. Other: No ascites or free air. The abdominal wall is normal. Musculoskeletal. Multilevel degenerative changes are noted throughout the lumbar spine, greatest at  the L4-L5 and L5-S1 levels. IMPRESSION: 1. Diffuse circumferential wall thickening of the descending colon to the level of the sigmoid colon, consistent with infectious or inflammatory colitis. 2. Sigmoid diverticulosis without CT evidence for diverticulitis. 3. Hepatic steatosis. Aortic Atherosclerosis (ICD10-I70.0). Electronically Signed   By: Constance Holster M.D.   On: 11/28/2019 21:22     Medications:   . ciprofloxacin Stopped (11/29/19 0004)  .  ciprofloxacin Stopped (11/30/19 0805)  . lactated ringers 75 mL/hr at 11/30/19 1130  . metronidazole 500 mg (11/30/19 0801)   . alum & mag hydroxide-simeth  15 mL Oral Once  . folic acid  3 mg Oral Daily  . heparin  5,000 Units Subcutaneous Q8H  . mometasone-formoterol  2 puff Inhalation BID  . pantoprazole  40 mg Oral Daily  . rosuvastatin  10 mg Oral Daily   acetaminophen, dicyclomine, melatonin, ondansetron (ZOFRAN) IV  Assessment/ Plan:  Ms. Jean Lawson is a 74 y.o. white femalewith hypertension, hyperlipidemia, GERD, asthma, rheumatoid arthritis , who was admitted to [facility] on 11/28/2019 for Colitis [K52.9] Acute renal failure (ARF) ([place]) [N17.9] Acute renal failure, unspecified acute renal failure type ([place]) [N17.9] Diarrhea, unspecified type [R19.7] Nausea and vomiting, intractability of vomiting not specified, unspecified vomiting type [R11.2]  1. Acute renal failure  2. Hyperkalemia 3. Hyponatremia 4. Metabolic acidosis 5. Hypotension  Secondary to prerenal azotemia from acute colitis, GI losses, poor PO intake and continuation of hydrochlorothiazide and lisinopril Baseline creatinine of 0.9, GFR of 63 on 10/16/2019 - Holding lisinopril and hydrochlorothiazide.  - IV fluids: LR at 25mL/hr - Encourage PO intake - Kidney function improving    LOS: 2 Donnie Gedeon 9/24/20211:24 PM

## 2019-11-30 NOTE — Evaluation (Signed)
Occupational Therapy Evaluation Patient Details Name: Jean Lawson MRN: [id number] DOB: 12-28-1945 Today's Date: 11/30/2019    History of Present Illness Jean Lawson is a 74 y.o. female with medical history significant for rheumatoid arthritis on immunosuppressive's, hypertension, GERD and hyperlipidemia who presented at the advice of her primary physician for acute renal failure.   Clinical Impression   Jean Lawson was seen for OT evaluation this date. Prior to hospital admission, pt was Independent in mobility and ADLs - recently requiring assist from husband for LBD 2/2 recent cataract surgery (pt reports not allowed to bend over following cataract surgery). Pt lives at [place] in 2 story home, does not go upstairs to husbands office. Pt typically walks 2 miles/day. Pt presents to acute OT demonstrating impaired ADL performance and functional mobility 2/2 decreased activity tolerance, functional strength/ROM deficits, and decreased LB access. Pt currently requires MOD A for LBD seated EOB - limited by abdominal pain. SBA + RW standing grooming tasks. MIN A for ADL t/f decreasing to CGA + RW for ~30 ft in room mobility. Pt would benefit from skilled OT to address noted impairments and functional limitations (see below for any additional details) in order to maximize safety and independence while minimizing falls risk and caregiver burden. Upon hospital discharge, recommend HHOT to maximize pt safety and return to functional independence during meaningful occupations of daily life.     Follow Up Recommendations  Home health OT;Supervision - Intermittent    Equipment Recommendations  3 in 1 bedside commode    Recommendations for Other Services       Precautions / Restrictions Precautions Precautions: None Restrictions Weight Bearing Restrictions: No      Mobility Bed Mobility Overal bed mobility: Needs Assistance Bed Mobility: Supine to Sit;Sit to Supine     Supine to  sit: Min assist Sit to supine: Min assist      Transfers Overall transfer level: Needs assistance Equipment used: Rolling walker (2 wheeled) Transfers: Sit to/from Stand Sit to Stand: Min assist      General transfer comment: Light assist to achieve standing decreasing to CGA + RW for ~30 ft in room mobility     Balance Overall balance assessment: Needs assistance Sitting-balance support: No upper extremity supported;Feet supported Sitting balance-Leahy Scale: Good     Standing balance support: During functional activity;No upper extremity supported Standing balance-Leahy Scale: Fair        ADL either performed or assessed with clinical judgement   ADL Overall ADL's : Needs assistance/impaired        General ADL Comments: MOD A for LBD seated EOB - limited by abdominal pain. SBA + RW standing grooming tasks. MIN A for ADL t/f     Vision Baseline Vision/History: Cataracts Additional Comments: Pt reports cataract surgery in 1 eye last week, plans to have next one done soon            Pertinent Vitals/Pain Pain Assessment: Faces Faces Pain Scale: Hurts a little bit Pain Location: abdomen  Pain Descriptors / Indicators: Grimacing;Discomfort Pain Intervention(s): Limited activity within patient's tolerance;Monitored during session        Extremity/Trunk Assessment Upper Extremity Assessment Upper Extremity Assessment: Generalized weakness   Lower Extremity Assessment Lower Extremity Assessment: Generalized weakness   Cervical / Trunk Assessment Cervical / Trunk Assessment: Normal   Communication Communication Communication: No difficulties   Cognition Arousal/Alertness: Awake/alert Behavior During Therapy: WFL for tasks assessed/performed Overall Cognitive Status: Within Functional Limits for tasks assessed  Exercises Exercises: Other exercises Other Exercises Other Exercises: Pt and husband educated re: OT role, DME recs, d/c recs,  importance of mobility for functional strengthening, ECS, falls prevention Other Exercises: LBD, UBD, sup<>sit, sit<>stand, sitting/standing balance/tolerance, ~30 ft in room mobility, self-drinking        Home Living Family/patient expects to be discharged to:: Private residence Living Arrangements: Spouse/significant other Available Help at Discharge: Family;Available 24 hours/day Type of Home: Independent living facility Home Access: Level entry     Home Layout: Two level;Able to live on main level with bedroom/bathroom     Bathroom Shower/Tub: Walk-in shower         Home Equipment: None   Additional Comments: Pt reports does not go upstairs - husbands office      Prior Functioning/Environment Level of Independence: Independent        Comments: Baseline walks 2 miles/day c husband. Residents of [place]. Reports husband asssisting c LBD ~last week 2/2 recent cataract surgery precaustions limiting bending over        OT Problem List: Decreased strength;Decreased range of motion;Decreased activity tolerance      OT Treatment/Interventions: Self-care/ADL training;Therapeutic exercise;Energy conservation;DME and/or AE instruction;Therapeutic activities;Patient/family education;Balance training    OT Goals(Current goals can be found in the care plan section) Acute Rehab OT Goals Patient Stated Goal: To feel stronger OT Goal Formulation: With patient/family Time For Goal Achievement: 12/14/19 Potential to Achieve Goals: Good ADL Goals Pt Will Perform Grooming: with modified independence;standing (c LRAD PRN) Pt Will Transfer to Toilet: with modified independence;ambulating;regular height toilet (c LRAD PRN) Additional ADL Goal #1: Pt will Independently verbalize plan to implement x3 ECS  OT Frequency: Min 1X/week    AM-PAC OT "6 Clicks" Daily Activity     Outcome Measure Help from another person eating meals?: None Help from another person taking care of  personal grooming?: None Help from another person toileting, which includes using toliet, bedpan, or urinal?: A Little Help from another person bathing (including washing, rinsing, drying)?: A Little Help from another person to put on and taking off regular upper body clothing?: None Help from another person to put on and taking off regular lower body clothing?: A Lot 6 Click Score: 20   End of Session    Activity Tolerance: Patient tolerated treatment well Patient left: in bed;with call bell/phone within reach;with family/visitor present  OT Visit Diagnosis: Muscle weakness (generalized) (M62.81)                Time: 1415-1440 OT Time Calculation (min): 25 min Charges:  OT General Charges $OT Visit: 1 Visit OT Evaluation $OT Eval Moderate Complexity: 1 Mod OT Treatments $Self Care/Home Management : 23-37 mins  Dessie Coma, M.S. OTR/L  11/30/19, 3:11 PM  ascom [phone]

## 2019-11-30 NOTE — Progress Notes (Signed)
PROGRESS NOTE    Jean Lawson  XIP:[id number] DOB: 12-16-1945 DOA: 11/28/2019 PCP: Baxter Hire, MD    Assessment & Plan:   Principal Problem:   Acute renal failure (ARF) ([place]) Active Problems:   Colitis   Hyperkalemia   Transaminitis   HTN (hypertension)   Rheumatoid arthritis ([place])   AKI: etiology unclear, likely secondary to vomiting/diarrhea. Continue on IVFs. Cr is still elevated but trending down.  Colitis: likely due to gastroenteritis. Improving abd pain. Continue on IV cipro, flagyl. Zofran prn for nausea/vomiting. Advanced to soft diet today   Hyperkalemia: resolved   Transaminitis: CT abdomen also shows hepatic steatosis. Labile. Will continue to monitor   RA: will hold home dose of etanercept, methotrexate   HTN: will hold home dose of ACE-I, HCTZ secondary to normal BP and AKI    DVT prophylaxis: heparin  Code Status: DNR Family Communication:  Disposition Plan: depends on OT/PT recs   Status is: Inpatient  Remains inpatient appropriate because:IV treatments appropriate due to intensity of illness or inability to take PO   Dispo: The patient is from: Home              Anticipated d/c is to: Home              Anticipated d/c date is: 3 days              Patient currently is not medically stable to d/c.     Consultants:   nephro   Procedures:   Antimicrobials: cipro, flagyl   Subjective: Pt c/o nausea   Objective: Vitals:   11/29/19 0800 11/29/19 1615 11/30/19 0035 11/30/19 0804  BP: 116/62 (!) 101/53 (!) 102/49 (!) 123/50  Pulse: 77 77 70 (!) 59  Resp: 16 18 16 20   Temp: 98.4 F (36.9 C) 98.2 F (36.8 C) 99 F (37.2 C) 98 F (36.7 C)  TempSrc:  Oral Oral Oral  SpO2: 99% 98% 96% 98%  Weight:      Height:        Intake/Output Summary (Last 24 hours) at 11/30/2019 6734 Last data filed at 11/29/2019 2005 Gross per 24 hour  Intake 560.32 ml  Output 225 ml  Net 335.32 ml   Filed Weights   11/28/19 1528  Weight:  53.5 kg    Examination:  General exam: Appears calm and comfortable Respiratory system: clear breath sounds b/l. No rales, rhonchi or wheezes. Cardiovascular system: S1/S2+, No rubs or gallops.  Gastrointestinal system: Abdomen is nondistended, soft and tender to palpation. Hyperactive bowel sounds heard. Central nervous system: Alert and oriented. Moves all 4 extremities  Psychiatry: Judgement and insight appear normal. Flat mood and affect.     Data Reviewed: I have personally reviewed following labs and imaging studies  CBC: Recent Labs  Lab 11/28/19 1914 11/29/19 0606 11/30/19 0539  WBC 11.7* 9.0 3.9*  HGB 13.8 11.2* 10.1*  HCT 40.0 32.3* 29.3*  MCV 89.3 90.5 90.2  PLT 332 251 193   Basic Metabolic Panel: Recent Labs  Lab 11/28/19 1914 11/29/19 0606 11/30/19 0539  NA 133* 136 137  K 5.3* 4.5 3.9  CL 99 106 107  CO2 17* 19* 23  GLUCOSE 104* 100* 86  BUN 103* 96* 68*  CREATININE 6.68* 5.50* 3.91*  CALCIUM 8.7* 7.7* 7.1*   GFR: Estimated Creatinine Clearance: 9.3 mL/min (A) (by C-G formula based on SCr of 3.91 mg/dL (H)). Liver Function Tests: Recent Labs  Lab 11/28/19 1914 11/29/19 0606 11/30/19 7902  AST 114* 114* 83*  ALT 103* 118* 133*  ALKPHOS 77 69 61  BILITOT 1.6* 1.1 1.1  PROT 7.4 6.0* 5.4*  ALBUMIN 3.1* 2.5* 2.1*   No results for input(s): LIPASE, AMYLASE in the last 168 hours. No results for input(s): AMMONIA in the last 168 hours. Coagulation Profile: No results for input(s): INR, PROTIME in the last 168 hours. Cardiac Enzymes: No results for input(s): CKTOTAL, CKMB, CKMBINDEX, TROPONINI in the last 168 hours. BNP (last 3 results) No results for input(s): PROBNP in the last 8760 hours. HbA1C: No results for input(s): HGBA1C in the last 72 hours. CBG: No results for input(s): GLUCAP in the last 168 hours. Lipid Profile: No results for input(s): CHOL, HDL, LDLCALC, TRIG, CHOLHDL, LDLDIRECT in the last 72 hours. Thyroid Function  Tests: No results for input(s): TSH, T4TOTAL, FREET4, T3FREE, THYROIDAB in the last 72 hours. Anemia Panel: No results for input(s): VITAMINB12, FOLATE, FERRITIN, TIBC, IRON, RETICCTPCT in the last 72 hours. Sepsis Labs: No results for input(s): PROCALCITON, LATICACIDVEN in the last 168 hours.  Recent Results (from the past 240 hour(s))  SARS Coronavirus 2 by RT PCR (hospital order, performed in [facility] hospital lab) Nasopharyngeal Nasopharyngeal Swab     Status: None   Collection Time: 11/28/19  9:26 PM   Specimen: Nasopharyngeal Swab  Result Value Ref Range Status   SARS Coronavirus 2 NEGATIVE NEGATIVE Final    Comment: (NOTE) SARS-CoV-2 target nucleic acids are NOT DETECTED.  The SARS-CoV-2 RNA is generally detectable in upper and lower respiratory specimens during the acute phase of infection. The lowest concentration of SARS-CoV-2 viral copies this assay can detect is 250 copies / mL. A negative result does not preclude SARS-CoV-2 infection and should not be used as the sole basis for treatment or other patient management decisions.  A negative result may occur with improper specimen collection / handling, submission of specimen other than nasopharyngeal swab, presence of viral mutation(s) within the areas targeted by this assay, and inadequate number of viral copies (<250 copies / mL). A negative result must be combined with clinical observations, patient history, and epidemiological information.  Fact Sheet for Patients:   StrictlyIdeas.no  Fact Sheet for Healthcare Providers: BankingDealers.co.za  This test is not yet approved or  cleared by the [place] FDA and has been authorized for detection and/or diagnosis of SARS-CoV-2 by FDA under an Emergency Use Authorization (EUA).  This EUA will remain in effect (meaning this test can be used) for the duration of the COVID-19 declaration under Section 564(b)(1) of the  Act, 21 U.S.C. section 360bbb-3(b)(1), unless the authorization is terminated or revoked sooner.  Performed at [facility], [address] [place], [place] [address]   Blood culture (routine x 2)     Status: None (Preliminary result)   Collection Time: 11/28/19 10:56 PM   Specimen: BLOOD  Result Value Ref Range Status   Specimen Description BLOOD BLOOD RIGHT FOREARM  Final   Special Requests   Final    BOTTLES DRAWN AEROBIC AND ANAEROBIC Blood Culture results may not be optimal due to an excessive volume of blood received in culture bottles   Culture   Final    NO GROWTH 2 DAYS Performed at [facility], [address], [place], [place] [address]    Report Status PENDING  Incomplete  Blood culture (routine x 2)     Status: None (Preliminary result)   Collection Time: 11/28/19 10:56 PM   Specimen: BLOOD  Result  Value Ref Range Status   Specimen Description BLOOD BLOOD LEFT HAND  Final   Special Requests   Final    BOTTLES DRAWN AEROBIC AND ANAEROBIC Blood Culture results may not be optimal due to an excessive volume of blood received in culture bottles   Culture   Final    NO GROWTH 2 DAYS Performed at [facility], [address] [place], [place] [address]    Report Status PENDING  Incomplete         Radiology Studies: CT ABDOMEN PELVIS WO CONTRAST  Result Date: 11/28/2019 CLINICAL DATA:  Nausea and lower abdominal pain. EXAM: CT ABDOMEN AND PELVIS WITHOUT CONTRAST TECHNIQUE: Multidetector CT imaging of the abdomen and pelvis was performed following the standard protocol without IV contrast. COMPARISON:  None. FINDINGS: Lower chest: The lung bases are clear. The heart size is normal. Hepatobiliary: There is decreased hepatic attenuation suggestive of hepatic steatosis. Normal gallbladder.There is no biliary ductal dilation. Pancreas: Normal contours without ductal dilatation. No peripancreatic fluid collection. Spleen: The spleen itself is  unremarkable. 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 Given their size, no further follow-up is necessary. Adrenals/Urinary Tract: --Adrenal glands: Unremarkable. --Right kidney/ureter: No hydronephrosis or radiopaque kidney stones. --Left kidney/ureter: No hydronephrosis or radiopaque kidney stones. --Urinary bladder: There is diffuse bladder wall thickening which may be secondary to underdistention. Stomach/Bowel: --Stomach/Duodenum: No hiatal hernia or other gastric abnormality. Normal duodenal course and caliber. --Small bowel: Unremarkable. --Colon: There is sigmoid diverticulosis. There is diffuse circumferential wall thickening of the descending colon to the level of the sigmoid colon. There is no abscess. No extraluminal free air. --Appendix: Normal. Vascular/Lymphatic: Atherosclerotic calcification is present within the non-aneurysmal abdominal aorta, without hemodynamically significant stenosis. --No retroperitoneal lymphadenopathy. --No mesenteric lymphadenopathy. --No pelvic or inguinal lymphadenopathy. Reproductive: Status post hysterectomy. No adnexal mass. Other: No ascites or free air. The abdominal wall is normal. Musculoskeletal. Multilevel degenerative changes are noted throughout the lumbar spine, greatest at the L4-L5 and L5-S1 levels. IMPRESSION: 1. Diffuse circumferential wall thickening of the descending colon to the level of the sigmoid colon, consistent with infectious or inflammatory colitis. 2. Sigmoid diverticulosis without CT evidence for diverticulitis. 3. Hepatic steatosis. Aortic Atherosclerosis (ICD10-I70.0). Electronically Signed   By: Constance Holster M.D.   On: 11/28/2019 21:22        Scheduled Meds: . alum & mag hydroxide-simeth  15 mL Oral Once  . folic acid  3 mg Oral Daily  . heparin  5,000 Units Subcutaneous Q8H  . mometasone-formoterol  2 puff Inhalation BID  . pantoprazole  40 mg Oral Daily  . rosuvastatin   10 mg Oral Daily   Continuous Infusions: . ciprofloxacin Stopped (11/29/19 0004)  . ciprofloxacin Stopped (11/30/19 0805)  . lactated ringers 75 mL/hr at 11/29/19 0244  . metronidazole 500 mg (11/30/19 0801)     LOS: 2 days    Time spent: 30 mins     Wyvonnia Dusky, MD [facility] Pager [phone]  If 7PM-7AM, please contact night-coverage [email or web address] 11/30/2019, 8:32 AM

## 2019-11-30 NOTE — Plan of Care (Signed)

## 2019-11-30 NOTE — Care Management Important Message (Signed)
Important Message  Patient Details  Name: Jean Lawson MRN: [id number] Date of Birth: January 09, 1946   Medicare Important Message Given:  Yes  Initial Medicare IM given by Patient Access Associate on 11/29/2019 at 10:55am.     Dannette Barbara 11/30/2019, 8:42 AM

## 2019-12-01 ENCOUNTER — Encounter: Payer: Self-pay | Admitting: Family Medicine

## 2019-12-01 LAB — GASTROINTESTINAL PANEL BY PCR, STOOL (REPLACES STOOL CULTURE)

## 2019-12-01 LAB — COMPREHENSIVE METABOLIC PANEL
ALT: 94 U/L — ABNORMAL HIGH (ref 0–44)
AST: 33 U/L (ref 15–41)
Albumin: 2.2 g/dL — ABNORMAL LOW (ref 3.5–5.0)
Alkaline Phosphatase: 74 U/L (ref 38–126)
Anion gap: 8 (ref 5–15)
BUN: 41 mg/dL — ABNORMAL HIGH (ref 8–23)
CO2: 22 mmol/L (ref 22–32)
Calcium: 7.7 mg/dL — ABNORMAL LOW (ref 8.9–10.3)
Chloride: 109 mmol/L (ref 98–111)
Creatinine, Ser: 2.8 mg/dL — ABNORMAL HIGH (ref 0.44–1.00)
GFR calc Af Amer: 18 mL/min — ABNORMAL LOW (ref >60)
GFR calc non Af Amer: 16 mL/min — ABNORMAL LOW (ref >60)
Glucose, Bld: 84 mg/dL (ref 70–99)
Potassium: 3.3 mmol/L — ABNORMAL LOW (ref 3.5–5.1)
Sodium: 139 mmol/L (ref 135–145)
Total Bilirubin: 0.9 mg/dL (ref 0.3–1.2)
Total Protein: 5.6 g/dL — ABNORMAL LOW (ref 6.5–8.1)

## 2019-12-01 LAB — CBC
HCT: 29.9 % — ABNORMAL LOW (ref 36.0–46.0)
Hemoglobin: 10.4 g/dL — ABNORMAL LOW (ref 12.0–15.0)
MCH: 31 pg (ref 26.0–34.0)
MCHC: 34.8 g/dL (ref 30.0–36.0)
MCV: 89.3 fL (ref 80.0–100.0)
Platelets: 243 10*3/uL (ref 150–400)
RBC: 3.35 MIL/uL — ABNORMAL LOW (ref 3.87–5.11)
RDW: 14.4 % (ref 11.5–15.5)
WBC: 5.2 10*3/uL (ref 4.0–10.5)
nRBC: 0 % (ref 0.0–0.2)

## 2019-12-01 MED ORDER — POTASSIUM CHLORIDE CRYS ER 20 MEQ PO TBCR
20.0000 meq | EXTENDED_RELEASE_TABLET | Freq: Once | ORAL | Status: AC
  Administered 2019-12-01: 20 meq via ORAL
  Filled 2019-12-01: qty 1

## 2019-12-01 MED ORDER — METRONIDAZOLE 500 MG PO TABS: 500.0000 mg | ORAL_TABLET | Freq: Three times a day (TID) | ORAL | 0 refills | Status: AC

## 2019-12-01 MED ORDER — CIPROFLOXACIN HCL 500 MG PO TABS: 500.0000 mg | ORAL_TABLET | Freq: Two times a day (BID) | ORAL | 0 refills | Status: AC

## 2019-12-01 NOTE — Progress Notes (Signed)
DISCHARGE NOTE:  Pt given discharge instructions and scripts. Pt verbalized understanding. Pt wheeled to car by staff.

## 2019-12-01 NOTE — Progress Notes (Signed)
Jean Lawson  MRN: [id number]  DOB/AGE: January 08, 1946 74 y.o.  Primary Care Physician:Johnston, Chrystie Nose, MD  Admit date: 11/28/2019  Chief Complaint:  Chief Complaint  Patient presents with  . Nausea    S-Pt presented on  11/28/2019 with  Chief Complaint  Patient presents with  . Nausea  .    Pt today feels better.    Pt main querry was " Can I gop home? I feel so much better?'    . alum & mag hydroxide-simeth  15 mL Oral Once  . folic acid  3 mg Oral Daily  . heparin  5,000 Units Subcutaneous Q8H  . mometasone-formoterol  2 puff Inhalation BID  . pantoprazole  40 mg Oral Daily  . rosuvastatin  10 mg Oral Daily         RDE:YCXKG from the symptoms mentioned above,there are no other symptoms referable to all systems reviewed.  Physical Exam: Vital signs in last 24 hours: Temp:  [97.8 F (36.6 C)-99.1 F (37.3 C)] 98.5 F (36.9 C) (09/25 0813) Pulse Rate:  [65-72] 72 (09/25 0813) Resp:  [16-18] 17 (09/25 0813) BP: (112-124)/(52-58) 124/57 (09/25 0813) SpO2:  [98 %-99 %] 98 % (09/25 0813) Weight change:  Last BM Date: 11/30/19  Intake/Output from previous day: 09/24 0701 - 09/25 0700 In: 1636.2 [I.V.:803.9; IV Piggyback:832.3] Out: -  Total I/O In: 543.4 [I.V.:443.4; IV Piggyback:100] Out: -    Physical Exam: General- pt is awake,alert, oriented to time place and person Resp- No acute REsp distress, CTA B/L NO Rhonchi CVS- S1S2 regular in rate and rhythm GIT- BS+, soft, NT, ND EXT- NO LE Edema, Cyanosis   Lab Results: CBC Recent Labs    11/30/19 0539 12/01/19 0614  WBC 3.9* 5.2  HGB 10.1* 10.4*  HCT 29.3* 29.9*  PLT 260 243    BMET Recent Labs    11/30/19 0539 12/01/19 0614  NA 137 139  K 3.9 3.3*  CL 107 109  CO2 23 22  GLUCOSE 86 84  BUN 68* 41*  CREATININE 3.91* 2.80*  CALCIUM 7.1* 7.7*   Creatinine trend 2021   6.6==>2.8  Potassium trend 2021 5.3==>3.3  Sodium trend 2021 133==>139   Bicarb trend 2021   17==>22  MICRO Recent Results (from the past 240 hour(s))  SARS Coronavirus 2 by RT PCR (hospital order, performed in [place] hospital lab) Nasopharyngeal Nasopharyngeal Swab     Status: None   Collection Time: 11/28/19  9:26 PM   Specimen: Nasopharyngeal Swab  Result Value Ref Range Status   SARS Coronavirus 2 NEGATIVE NEGATIVE Final    Comment: (NOTE) SARS-CoV-2 target nucleic acids are NOT DETECTED.  The SARS-CoV-2 RNA is generally detectable in upper and lower respiratory specimens during the acute phase of infection. The lowest concentration of SARS-CoV-2 viral copies this assay can detect is 250 copies / mL. A negative result does not preclude SARS-CoV-2 infection and should not be used as the sole basis for treatment or other patient management decisions.  A negative result may occur with improper specimen collection / handling, submission of specimen other than nasopharyngeal swab, presence of viral mutation(s) within the areas targeted by this assay, and inadequate number of viral copies (<250 copies / mL). A negative result must be combined with clinical observations, patient history, and epidemiological information.  Fact Sheet for Patients:   StrictlyIdeas.no  Fact Sheet for Healthcare Providers: BankingDealers.co.za  This test is not yet approved or  cleared by the [place] FDA and  has been authorized for detection and/or diagnosis of SARS-CoV-2 by FDA under an Emergency Use Authorization (EUA).  This EUA will remain in effect (meaning this test can be used) for the duration of the COVID-19 declaration under Section 564(b)(1) of the Act, 21 U.S.C. section 360bbb-3(b)(1), unless the authorization is terminated or revoked sooner.  Performed at [facility], [address] [place], [place] [address]   Blood culture (routine x 2)     Status: None (Preliminary result)   Collection Time: 11/28/19 10:56  PM   Specimen: BLOOD  Result Value Ref Range Status   Specimen Description BLOOD BLOOD RIGHT FOREARM  Final   Special Requests   Final    BOTTLES DRAWN AEROBIC AND ANAEROBIC Blood Culture results may not be optimal due to an excessive volume of blood received in culture bottles   Culture   Final    NO GROWTH 3 DAYS Performed at [facility] [address] [place], [place] [address]    Report Status PENDING  Incomplete  Blood culture (routine x 2)     Status: None (Preliminary result)   Collection Time: 11/28/19 10:56 PM   Specimen: BLOOD  Result Value Ref Range Status   Specimen Description BLOOD BLOOD LEFT HAND  Final   Special Requests   Final    BOTTLES DRAWN AEROBIC AND ANAEROBIC Blood Culture results may not be optimal due to an excessive volume of blood received in culture bottles   Culture   Final    NO GROWTH 3 DAYS Performed at [facility], [address] [place],  [address]    Report Status PENDING  Incomplete      Lab Results  Component Value Date   CALCIUM 7.7 (L) 12/01/2019               Impression:   Ms. Jean Lawson is a 74 y.o. Caucasian female with hypertension, hyperlipidemia, GERD, asthma, rheumatoid arthritis, who was admitted to [facility] on 9/22/2021for  Colitis [K52.9] Acute renal failure (ARF) (HCC) [N17.9] Acute renal failure, unspecified acute renal failure type (HCC) [N17.9] Diarrhea, unspecified type [R19.7] Nausea and vomiting, intractability of vomiting not specified, unspecified vomiting type [R11.2]  1)Renal  AKI secondary to prerenal/ATN Patient had acute kidney injury secondary to multiple factors  -Hypovolemia/hypotension/hydrochlorothiazide/lisinopril on board  Patient AKI is now better Patient creatinine is trending down   2) hypotension Patient had low blood pressure earlier His blood pressure is now better  3)Anemia of chronic disease  HGb at goal (9--11)   4) hyponatremia Now  better  5) hyperkalemia Patient potassium is now on the low side Will replete  6) Acid base Patient had  nonANIONgap acidosis Patient bicarb is now better Co2 at goal  7) colitis Patient is on IV Cipro and metronidazole Patient is clinically better   Plan:   Patient is now clinically much better Patient wishes to go home If patient is discharged patient will benefit from outpatient nephrology follow-up    [place] [facility] 12/01/2019, 11:28 AM

## 2019-12-01 NOTE — TOC Transition Note (Signed)
Transition of Care [facility]) - CM/SW Discharge Note   Patient Details  Name: Jean Lawson MRN: [id number] Date of Birth: 1945/04/25  [facility] CM/SW Contact:  Boris Sharper, LCSW Phone Number: 12/01/2019, 3:52 PM   Clinical Narrative:    Pt is medically stable for discharge per MD. Pt will be transported home by her spouse. Pt lives at [facility] and [facility] willl be established with them, orders are in Pt stated that she already has a walker at home.      Final next level of care: [place] Barriers to Discharge: No Barriers Identified   Patient Goals and CMS Choice Patient states their goals for this hospitalization and ongoing recovery are:: home to recover CMS Medicare.gov Compare Post Acute Care list provided to:: Patient    Discharge Placement                Patient to be transferred to facility by: spouse Name of family member notified: russell Patient and family notified of of transfer: 12/01/19  Discharge Plan and Services                                     Social Determinants of Health (SDOH) Interventions     Readmission Risk Interventions No flowsheet data found.

## 2019-12-01 NOTE — Discharge Summary (Signed)
Physician Discharge Summary  THERSEA MANFREDONIA WVP:[id number] DOB: 1945/05/20 DOA: 11/28/2019  PCP: Baxter Hire, MD  Admit date: 11/28/2019 Discharge date: 12/01/2019  Admitted From: home  Disposition:  Home w/ home health   Recommendations for Outpatient Follow-up:  1. Follow up with PCP in 1 week  2. F/u nephro in 2 weeks   Home Health: yes Equipment/Devices:  Discharge Condition: stable  CODE STATUS: DNR Diet recommendation: Heart Healthy   Brief/Interim Summary: HPI was taken from Dr. Flossie Buffy: Jean Lawson is a 74 y.o. female with medical history significant for rheumatoid arthritis on immunosuppressive's, hypertension, GERD and hyperlipidemia who presented at the advice of her primary physician for acute renal failure.  Patient had a outpatient cataract surgery on 9/17 and shortly following the surgery when she got home she began to have profuse nausea and vomiting for about 3 to 4 hours. She then had another 4 hours of diarrhea. She also fell later that day getting up from the commode due to generalized weakness and has been since she had loss of consciousness for about 15 seconds. Her vomiting has since resolved but she has been having occasional on and off diarrhea. However she took an Imodium today and her diarrhea has stopped.  About 2 days ago she also began to notice decreased urine output. Has been having decreased p.o. intake. Patient reports that she has had trouble with anesthesia in the past and thinks it triggered all of this. She denies any fever. No sick contact. Denies any recent antibiotics. Also has been having crampy abdominal pain. She also notes discomfort to her chest/epigastric region and has been having trouble taking deep breaths. She saw her primary physician today for follow-up and had lab work showing elevated creatinine of 6.8 from a prior of 0.9.  ED Course: Patient has not had any more episodes of vomiting or diarrhea since being in the ED. She was  afebrile normotensive on room air. Had mild leukocytosis of 11.7. Sodium 133, potassium 5.3, glucose 104, creatinine of 6.68 with BUN of 103, elevated AST of 114, ALT of 103, anion gap of 17.  CT abdomen and pelvis showed diffuse circumferential wall thickening of the descending colon to the level of the sigmoid colon consistent with infectious or inflammatory colitis.  Hospital Course from Dr. Lenise Herald 9/23-9/25/21: Pt presented w/ nausea, vomiting, and diarrhea. Pt was found to have AKI likely secondary to vomiting/diarrhea. Pt Cr improved daily w/ IVFs and Cr was 2.8 on day of d/c. Pt will f/u w/ nephro in 2 weeks after d/c. Of note, pt was also found to have colitis of unknown etiology. Pt was treated w/ IV cipro and flagyl. Pt thought all above and below stated symptoms were secondary to recent anesthesia from eye surgery at [facility]. Pt has had problems in the past with anesthesia. For more information, please see other progress notes.   Discharge Diagnoses:  Principal Problem:   Acute renal failure (ARF) (HCC) Active Problems:   Colitis   Hyperkalemia   Transaminitis   HTN (hypertension)   Rheumatoid arthritis (HCC)   AKI: etiology unclear, likely secondary to vomiting/diarrhea. Cr continues to trend down daily w/ IVFs   Colitis: likely due to gastroenteritis. Improving abd pain. Continue on cipro, flagyl. Zofran prn for nausea/vomiting. Advanced to soft diet today   Hyperkalemia: resolved    Hypokalemia: KCl repleated. Will continue to monitor   Transaminitis: CT abdomen also shows hepatic steatosis.Trending down.  Will continue to monitor  RA: will hold home dose of etanercept, methotrexate while inpatient   HTN: will hold home dose of ACE-I, HCTZ while inpatient    Discharge Instructions  Discharge Instructions    Diet - low sodium heart healthy   Complete by: As directed    Discharge instructions   Complete by: As directed    F/u PCP in 1 week. F/u nephro in 2  weeks   Increase activity slowly   Complete by: As directed      Allergies as of 12/01/2019      Reactions   Codeine    Latex    Oatmeal    Penicillins    Shellfish Allergy    Sulfa Antibiotics       Medication List    TAKE these medications   CALCIUM 600 + D PO Take 1 tablet by mouth 2 (two) times daily.   ciprofloxacin 500 MG tablet Commonly known as: Cipro Take 1 tablet (500 mg total) by mouth 2 (two) times daily for 3 days.   Enbrel 50 MG/ML injection Generic drug: etanercept Inject 50 mg into the skin every Saturday.   Fluticasone-Salmeterol 100-50 MCG/DOSE Aepb Commonly known as: ADVAIR Inhale 1 puff into the lungs 2 (two) times daily.   folic acid 1 MG tablet Commonly known as: FOLVITE Take 3 mg by mouth daily.   hydrochlorothiazide 25 MG tablet Commonly known as: HYDRODIURIL Take 25 mg by mouth daily.   levocetirizine 5 MG tablet Commonly known as: XYZAL Take 5 mg by mouth every evening.   lisinopril 5 MG tablet Commonly known as: ZESTRIL Take 5 mg by mouth daily.   Melatonin 3 MG Caps Take 3 mg by mouth at bedtime as needed (sleep).   methotrexate 2.5 MG tablet Commonly known as: RHEUMATREX Take 7.5 mg by mouth every Monday.   metroNIDAZOLE 500 MG tablet Commonly known as: Flagyl Take 1 tablet (500 mg total) by mouth 3 (three) times daily for 3 days.   multivitamin with minerals Tabs tablet Take 1 tablet by mouth daily.   nabumetone 500 MG tablet Commonly known as: RELAFEN Take 500 mg by mouth daily.   OMEGA-3 FATTY ACIDS PO Take 1 capsule by mouth 2 (two) times daily.   omeprazole 20 MG capsule Commonly known as: PRILOSEC Take 20 mg by mouth as directed. Six days per week. Hold dose on Fridays   ondansetron 4 MG disintegrating tablet Commonly known as: ZOFRAN-ODT Take 4 mg by mouth every 8 (eight) hours as needed for nausea or vomiting.   Prednisolon-Moxiflox-Bromfenac 1-0.5-0.075 % Soln Apply 1 drop to eye 2 (two) times daily.    risedronate 35 MG tablet Commonly known as: ACTONEL Take 35 mg by mouth every 7 (seven) days. with water on empty stomach, nothing by mouth or lie down for next 30 minutes.   rosuvastatin 10 MG tablet Commonly known as: CRESTOR Take 10 mg by mouth daily.            Durable Medical Equipment  (From admission, onward)         Start     Ordered   12/01/19 0800  For home use only DME Walker rolling  Once       Question Answer Comment  Walker: With 5 Inch Wheels   Patient needs a walker to treat with the following condition Generalized weakness      12/01/19 0801          Allergies  Allergen Reactions  . Codeine   . Latex   .  Oatmeal   . Penicillins   . Shellfish Allergy   . Sulfa Antibiotics     Consultations:  nephro    Procedures/Studies: CT ABDOMEN PELVIS WO CONTRAST  Result Date: 11/28/2019 CLINICAL DATA:  Nausea and lower abdominal pain. EXAM: CT ABDOMEN AND PELVIS WITHOUT CONTRAST TECHNIQUE: Multidetector CT imaging of the abdomen and pelvis was performed following the standard protocol without IV contrast. COMPARISON:  None. FINDINGS: Lower chest: The lung bases are clear. The heart size is normal. Hepatobiliary: There is decreased hepatic attenuation suggestive of hepatic steatosis. Normal gallbladder.There is no biliary ductal dilation. Pancreas: Normal contours without ductal dilatation. No peripancreatic fluid collection. Spleen: The spleen itself is unremarkable. 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 Given their size, no further follow-up is necessary. Adrenals/Urinary Tract: --Adrenal glands: Unremarkable. --Right kidney/ureter: No hydronephrosis or radiopaque kidney stones. --Left kidney/ureter: No hydronephrosis or radiopaque kidney stones. --Urinary bladder: There is diffuse bladder wall thickening which may be secondary to underdistention. Stomach/Bowel: --Stomach/Duodenum: No hiatal hernia or other  gastric abnormality. Normal duodenal course and caliber. --Small bowel: Unremarkable. --Colon: There is sigmoid diverticulosis. There is diffuse circumferential wall thickening of the descending colon to the level of the sigmoid colon. There is no abscess. No extraluminal free air. --Appendix: Normal. Vascular/Lymphatic: Atherosclerotic calcification is present within the non-aneurysmal abdominal aorta, without hemodynamically significant stenosis. --No retroperitoneal lymphadenopathy. --No mesenteric lymphadenopathy. --No pelvic or inguinal lymphadenopathy. Reproductive: Status post hysterectomy. No adnexal mass. Other: No ascites or free air. The abdominal wall is normal. Musculoskeletal. Multilevel degenerative changes are noted throughout the lumbar spine, greatest at the L4-L5 and L5-S1 levels. IMPRESSION: 1. Diffuse circumferential wall thickening of the descending colon to the level of the sigmoid colon, consistent with infectious or inflammatory colitis. 2. Sigmoid diverticulosis without CT evidence for diverticulitis. 3. Hepatic steatosis. Aortic Atherosclerosis (ICD10-I70.0). Electronically Signed   By: Constance Holster M.D.   On: 11/28/2019 21:22       Subjective: Pt c/o fatigue    Discharge Exam: Vitals:   12/01/19 0020 12/01/19 0813  BP: (!) 112/58 (!) 124/57  Pulse: 71 72  Resp: 18 17  Temp: 99.1 F (37.3 C) 98.5 F (36.9 C)  SpO2: 98% 98%   Vitals:   11/30/19 0804 11/30/19 1513 12/01/19 0020 12/01/19 0813  BP: (!) 123/50 (!) 120/52 (!) 112/58 (!) 124/57  Pulse: (!) 59 65 71 72  Resp: 20 16 18 17   Temp: 98 F (36.7 C) 97.8 F (36.6 C) 99.1 F (37.3 C) 98.5 F (36.9 C)  TempSrc: Oral Oral Oral Oral  SpO2: 98% 99% 98% 98%  Weight:      Height:        General: Pt is alert, awake, not in acute distress Cardiovascular: S1/S2 +, no rubs, no gallops Respiratory: CTA bilaterally, no wheezing, no rhonchi Abdominal: Soft, NT, ND, bowel sounds + Extremities: no edema, no  cyanosis    The results of significant diagnostics from this hospitalization (including imaging, microbiology, ancillary and laboratory) are listed below for reference.     Microbiology: Recent Results (from the past 240 hour(s))  SARS Coronavirus 2 by RT PCR (hospital order, performed in [facility] hospital lab) Nasopharyngeal Nasopharyngeal Swab     Status: None   Collection Time: 11/28/19  9:26 PM   Specimen: Nasopharyngeal Swab  Result Value Ref Range Status   SARS Coronavirus 2 NEGATIVE NEGATIVE Final    Comment: (NOTE) SARS-CoV-2 target nucleic acids are NOT DETECTED.  The  SARS-CoV-2 RNA is generally detectable in upper and lower respiratory specimens during the acute phase of infection. The lowest concentration of SARS-CoV-2 viral copies this assay can detect is 250 copies / mL. A negative result does not preclude SARS-CoV-2 infection and should not be used as the sole basis for treatment or other patient management decisions.  A negative result may occur with improper specimen collection / handling, submission of specimen other than nasopharyngeal swab, presence of viral mutation(s) within the areas targeted by this assay, and inadequate number of viral copies (<250 copies / mL). A negative result must be combined with clinical observations, patient history, and epidemiological information.  Fact Sheet for Patients:   StrictlyIdeas.no  Fact Sheet for Healthcare Providers: BankingDealers.co.za  This test is not yet approved or  cleared by the [place] FDA and has been authorized for detection and/or diagnosis of SARS-CoV-2 by FDA under an Emergency Use Authorization (EUA).  This EUA will remain in effect (meaning this test can be used) for the duration of the COVID-19 declaration under Section 564(b)(1) of the Act, 21 U.S.C. section 360bbb-3(b)(1), unless the authorization is terminated or revoked sooner.  Performed  at [facility], [address] [place], [place] [address]   Blood culture (routine x 2)     Status: None (Preliminary result)   Collection Time: 11/28/19 10:56 PM   Specimen: BLOOD  Result Value Ref Range Status   Specimen Description BLOOD BLOOD RIGHT FOREARM  Final   Special Requests   Final    BOTTLES DRAWN AEROBIC AND ANAEROBIC Blood Culture results may not be optimal due to an excessive volume of blood received in culture bottles   Culture   Final    NO GROWTH 3 DAYS Performed at [facility], [address], [place], [place] [address]    Report Status PENDING  Incomplete  Blood culture (routine x 2)     Status: None (Preliminary result)   Collection Time: 11/28/19 10:56 PM   Specimen: BLOOD  Result Value Ref Range Status   Specimen Description BLOOD BLOOD LEFT HAND  Final   Special Requests   Final    BOTTLES DRAWN AEROBIC AND ANAEROBIC Blood Culture results may not be optimal due to an excessive volume of blood received in culture bottles   Culture   Final    NO GROWTH 3 DAYS Performed at [facility], [address] [place], [place] [address]    Report Status PENDING  Incomplete  Gastrointestinal Panel by PCR , Stool     Status: Abnormal   Collection Time: 12/01/19 10:58 AM   Specimen: Stool  Result Value Ref Range Status   Campylobacter species NOT DETECTED NOT DETECTED Final   Plesimonas shigelloides NOT DETECTED NOT DETECTED Final   Salmonella species NOT DETECTED NOT DETECTED Final   Yersinia enterocolitica NOT DETECTED NOT DETECTED Final   Vibrio species NOT DETECTED NOT DETECTED Final   Vibrio cholerae NOT DETECTED NOT DETECTED Final   Enteroaggregative E coli (EAEC) NOT DETECTED NOT DETECTED Final   Enterotoxigenic E coli (ETEC) NOT DETECTED NOT DETECTED Final   Shiga like toxin producing E coli (STEC) DETECTED (A) NOT DETECTED Final    Comment: RESULT CALLED TO, READ BACK BY AND VERIFIED WITH: AMANDA CAMPBELL AT 1256 12/01/19  SDR    E. coli O157 NOT DETECTED NOT DETECTED Final   Shigella/Enteroinvasive E coli (EIEC) NOT DETECTED NOT DETECTED Final   Cryptosporidium NOT DETECTED NOT DETECTED Final   Cyclospora cayetanensis  NOT DETECTED NOT DETECTED Final   Entamoeba histolytica NOT DETECTED NOT DETECTED Final   Giardia lamblia NOT DETECTED NOT DETECTED Final   Adenovirus F40/41 NOT DETECTED NOT DETECTED Final   Astrovirus NOT DETECTED NOT DETECTED Final   Norovirus GI/GII NOT DETECTED NOT DETECTED Final   Rotavirus A NOT DETECTED NOT DETECTED Final   Sapovirus (I, II, IV, and V) NOT DETECTED NOT DETECTED Final    Comment: Performed at [facility], [address] [place], [place] [address]     Labs: BNP (last 3 results) No results for input(s): BNP in the last 8760 hours. Basic Metabolic Panel: Recent Labs  Lab 11/28/19 1914 11/29/19 0606 11/30/19 0539 12/01/19 0614  NA 133* 136 137 139  K 5.3* 4.5 3.9 3.3*  CL 99 106 107 109  CO2 17* 19* 23 22  GLUCOSE 104* 100* 86 84  BUN 103* 96* 68* 41*  CREATININE 6.68* 5.50* 3.91* 2.80*  CALCIUM 8.7* 7.7* 7.1* 7.7*   Liver Function Tests: Recent Labs  Lab 11/28/19 1914 11/29/19 0606 11/30/19 0539 12/01/19 0614  AST 114* 114* 83* 33  ALT 103* 118* 133* 94*  ALKPHOS 77 69 61 74  BILITOT 1.6* 1.1 1.1 0.9  PROT 7.4 6.0* 5.4* 5.6*  ALBUMIN 3.1* 2.5* 2.1* 2.2*   No results for input(s): LIPASE, AMYLASE in the last 168 hours. No results for input(s): AMMONIA in the last 168 hours. CBC: Recent Labs  Lab 11/28/19 1914 11/29/19 0606 11/30/19 0539 12/01/19 0614  WBC 11.7* 9.0 3.9* 5.2  HGB 13.8 11.2* 10.1* 10.4*  HCT 40.0 32.3* 29.3* 29.9*  MCV 89.3 90.5 90.2 89.3  PLT 332 251 260 243   Cardiac Enzymes: No results for input(s): CKTOTAL, CKMB, CKMBINDEX, TROPONINI in the last 168 hours. BNP: Invalid input(s): POCBNP CBG: No results for input(s): GLUCAP in the last 168 hours. D-Dimer No results for input(s): DDIMER in the last 72  hours. Hgb A1c No results for input(s): HGBA1C in the last 72 hours. Lipid Profile No results for input(s): CHOL, HDL, LDLCALC, TRIG, CHOLHDL, LDLDIRECT in the last 72 hours. Thyroid function studies No results for input(s): TSH, T4TOTAL, T3FREE, THYROIDAB in the last 72 hours.  Invalid input(s): FREET3 Anemia work up No results for input(s): VITAMINB12, FOLATE, FERRITIN, TIBC, IRON, RETICCTPCT in the last 72 hours. Urinalysis    Component Value Date/Time   COLORURINE YELLOW (A) 11/29/2019 0300   APPEARANCEUR HAZY (A) 11/29/2019 0300   LABSPEC 1.009 11/29/2019 0300   PHURINE 5.0 11/29/2019 0300   GLUCOSEU NEGATIVE 11/29/2019 0300   HGBUR SMALL (A) 11/29/2019 0300   BILIRUBINUR NEGATIVE 11/29/2019 0300   KETONESUR NEGATIVE 11/29/2019 0300   PROTEINUR NEGATIVE 11/29/2019 0300   NITRITE NEGATIVE 11/29/2019 0300   LEUKOCYTESUR NEGATIVE 11/29/2019 0300   Sepsis Labs Invalid input(s): PROCALCITONIN,  WBC,  LACTICIDVEN Microbiology Recent Results (from the past 240 hour(s))  SARS Coronavirus 2 by RT PCR (hospital order, performed in [place] hospital lab) Nasopharyngeal Nasopharyngeal Swab     Status: None   Collection Time: 11/28/19  9:26 PM   Specimen: Nasopharyngeal Swab  Result Value Ref Range Status   SARS Coronavirus 2 NEGATIVE NEGATIVE Final    Comment: (NOTE) SARS-CoV-2 target nucleic acids are NOT DETECTED.  The SARS-CoV-2 RNA is generally detectable in upper and lower respiratory specimens during the acute phase of infection. The lowest concentration of SARS-CoV-2 viral copies this assay can detect is 250 copies / mL. A negative result does not preclude SARS-CoV-2 infection  and should not be used as the sole basis for treatment or other patient management decisions.  A negative result may occur with improper specimen collection / handling, submission of specimen other than nasopharyngeal swab, presence of viral mutation(s) within the areas targeted by this assay,  and inadequate number of viral copies (<250 copies / mL). A negative result must be combined with clinical observations, patient history, and epidemiological information.  Fact Sheet for Patients:   StrictlyIdeas.no  Fact Sheet for Healthcare Providers: BankingDealers.co.za  This test is not yet approved or  cleared by the [place] FDA and has been authorized for detection and/or diagnosis of SARS-CoV-2 by FDA under an Emergency Use Authorization (EUA).  This EUA will remain in effect (meaning this test can be used) for the duration of the COVID-19 declaration under Section 564(b)(1) of the Act, 21 U.S.C. section 360bbb-3(b)(1), unless the authorization is terminated or revoked sooner.  Performed at [facility], [address] [place], [place] [address]   Blood culture (routine x 2)     Status: None (Preliminary result)   Collection Time: 11/28/19 10:56 PM   Specimen: BLOOD  Result Value Ref Range Status   Specimen Description BLOOD BLOOD RIGHT FOREARM  Final   Special Requests   Final    BOTTLES DRAWN AEROBIC AND ANAEROBIC Blood Culture results may not be optimal due to an excessive volume of blood received in culture bottles   Culture   Final    NO GROWTH 3 DAYS Performed at [facility], [address] [place], [place] [address]    Report Status PENDING  Incomplete  Blood culture (routine x 2)     Status: None (Preliminary result)   Collection Time: 11/28/19 10:56 PM   Specimen: BLOOD  Result Value Ref Range Status   Specimen Description BLOOD BLOOD LEFT HAND  Final   Special Requests   Final    BOTTLES DRAWN AEROBIC AND ANAEROBIC Blood Culture results may not be optimal due to an excessive volume of blood received in culture bottles   Culture   Final    NO GROWTH 3 DAYS Performed at [facility], [address] [place], [place] [address]    Report Status PENDING  Incomplete   Gastrointestinal Panel by PCR , Stool     Status: Abnormal   Collection Time: 12/01/19 10:58 AM   Specimen: Stool  Result Value Ref Range Status   Campylobacter species NOT DETECTED NOT DETECTED Final   Plesimonas shigelloides NOT DETECTED NOT DETECTED Final   Salmonella species NOT DETECTED NOT DETECTED Final   Yersinia enterocolitica NOT DETECTED NOT DETECTED Final   Vibrio species NOT DETECTED NOT DETECTED Final   Vibrio cholerae NOT DETECTED NOT DETECTED Final   Enteroaggregative E coli (EAEC) NOT DETECTED NOT DETECTED Final   Enterotoxigenic E coli (ETEC) NOT DETECTED NOT DETECTED Final   Shiga like toxin producing E coli (STEC) DETECTED (A) NOT DETECTED Final    Comment: RESULT CALLED TO, READ BACK BY AND VERIFIED WITH: AMANDA CAMPBELL AT 3151 12/01/19 SDR    E. coli O157 NOT DETECTED NOT DETECTED Final   Shigella/Enteroinvasive E coli (EIEC) NOT DETECTED NOT DETECTED Final   Cryptosporidium NOT DETECTED NOT DETECTED Final   Cyclospora cayetanensis NOT DETECTED NOT DETECTED Final   Entamoeba histolytica NOT DETECTED NOT DETECTED Final   Giardia lamblia NOT DETECTED NOT DETECTED Final   Adenovirus F40/41 NOT DETECTED NOT DETECTED Final   Astrovirus NOT DETECTED NOT DETECTED Final  Norovirus GI/GII NOT DETECTED NOT DETECTED Final   Rotavirus A NOT DETECTED NOT DETECTED Final   Sapovirus (I, II, IV, and V) NOT DETECTED NOT DETECTED Final    Comment: Performed at [facility], [address] [place], [place] [address]     Time coordinating discharge: Over 30 minutes  SIGNED:   Wyvonnia Dusky, MD  [facility] 12/01/2019, 2:05 PM Pager   If 7PM-7AM, please contact night-coverage [email or web address]

## 2019-12-03 LAB — CULTURE, BLOOD (ROUTINE X 2)
Culture: NO GROWTH
Culture: NO GROWTH

## 2020-04-03 ENCOUNTER — Other Ambulatory Visit: Payer: Self-pay

## 2020-04-03 ENCOUNTER — Ambulatory Visit (INDEPENDENT_AMBULATORY_CARE_PROVIDER_SITE_OTHER): Payer: Medicare Other | Admitting: Dermatology

## 2020-04-03 DIAGNOSIS — D229 Melanocytic nevi, unspecified: Secondary | ICD-10-CM

## 2020-04-03 DIAGNOSIS — L814 Other melanin hyperpigmentation: Secondary | ICD-10-CM

## 2020-04-03 DIAGNOSIS — L578 Other skin changes due to chronic exposure to nonionizing radiation: Secondary | ICD-10-CM

## 2020-04-03 DIAGNOSIS — Z872 Personal history of diseases of the skin and subcutaneous tissue: Secondary | ICD-10-CM

## 2020-04-03 DIAGNOSIS — I8393 Asymptomatic varicose veins of bilateral lower extremities: Secondary | ICD-10-CM | POA: Diagnosis not present

## 2020-04-03 DIAGNOSIS — L821 Other seborrheic keratosis: Secondary | ICD-10-CM

## 2020-04-03 DIAGNOSIS — D1800 Hemangioma unspecified site: Secondary | ICD-10-CM

## 2020-04-03 DIAGNOSIS — L720 Epidermal cyst: Secondary | ICD-10-CM | POA: Diagnosis not present

## 2020-04-03 DIAGNOSIS — Z1283 Encounter for screening for malignant neoplasm of skin: Secondary | ICD-10-CM

## 2020-04-03 NOTE — Progress Notes (Signed)
   Follow-Up Visit   Subjective  Jean Lawson is a 75 y.o. female who presents for the following: Annual Exam (History of AK - TBSE today). The patient presents for Total-Body Skin Exam (TBSE) for skin cancer screening and mole check.  The following portions of the chart were reviewed this encounter and updated as appropriate:   Tobacco  Allergies  Meds  Problems  Med Hx  Surg Hx  Fam Hx     Review of Systems:  No other skin or systemic complaints except as noted in HPI or Assessment and Plan.  Objective  Well appearing patient in no apparent distress; mood and affect are within normal limits.  A full examination was performed including scalp, head, eyes, ears, nose, lips, neck, chest, axillae, abdomen, back, buttocks, bilateral upper extremities, bilateral lower extremities, hands, feet, fingers, toes, fingernails, and toenails. All findings within normal limits unless otherwise noted below.  Objective  Lower legs: Spider veins  Objective  Left cheek: 2.0 x 1.0 cm cystic papule   Assessment & Plan    Lentigines - Scattered tan macules - Discussed due to sun exposure - Benign, observe - Call for any changes  Seborrheic Keratoses - Stuck-on, waxy, tan-brown papules and plaques  - Discussed benign etiology and prognosis. - Observe - Call for any changes  Melanocytic Nevi - Tan-brown and/or pink-flesh-colored symmetric macules and papules - Benign appearing on exam today - Observation - Call clinic for new or changing moles - Recommend daily use of broad spectrum spf 30+ sunscreen to sun-exposed areas.   Hemangiomas - Red papules - Discussed benign nature - Observe - Call for any changes  Actinic Damage - Chronic, secondary to cumulative UV/sun exposure - diffuse scaly erythematous macules with underlying dyspigmentation - Recommend daily broad spectrum sunscreen SPF 30+ to sun-exposed areas, reapply every 2 hours as needed.  - Call for new or changing  lesions.  Skin cancer screening performed today.  Spider veins of both lower extremities Lower legs  Benign, observe.    Epidermal inclusion cyst Left cheek  Discussed excision. Patient may schedule in the future.  Skin cancer screening  Return for 1-2 years.  I, Ashok Cordia, CMA, am acting as scribe for Sarina Ser, MD .  Documentation: I have reviewed the above documentation for accuracy and completeness, and I agree with the above.  Sarina Ser, MD

## 2020-04-16 ENCOUNTER — Encounter: Payer: Self-pay | Admitting: Dermatology

## 2020-07-22 ENCOUNTER — Other Ambulatory Visit: Payer: Self-pay | Admitting: Internal Medicine

## 2020-07-22 DIAGNOSIS — Z1231 Encounter for screening mammogram for malignant neoplasm of breast: Secondary | ICD-10-CM

## 2020-09-01 ENCOUNTER — Ambulatory Visit
Admission: RE | Admit: 2020-09-01 | Discharge: 2020-09-01 | Disposition: A | Discharge status: Home / Self Care | Payer: Medicare Other | Source: Ambulatory Visit | Attending: Internal Medicine | Admitting: Internal Medicine

## 2020-09-01 ENCOUNTER — Other Ambulatory Visit: Payer: Self-pay

## 2020-09-01 DIAGNOSIS — Z1231 Encounter for screening mammogram for malignant neoplasm of breast: Secondary | ICD-10-CM | POA: Diagnosis not present

## 2021-02-18 ENCOUNTER — Other Ambulatory Visit: Payer: Self-pay | Admitting: Podiatry

## 2021-02-24 ENCOUNTER — Encounter: Payer: Self-pay | Admitting: Podiatry

## 2021-02-25 NOTE — Discharge Instructions (Addendum)
[place]  POST OPERATIVE INSTRUCTIONS FOR DR. Vickki Muff AND DR. Boyd   Take your medication as prescribed.  Pain medication should be taken only as needed.  You may also take ibuprofen or Tylenol as needed.  If worsening pain or discomfort that is severe and uncontrolled with pain medication please contact for instructions.  Block that was given today can last anywhere between 3 days to sometimes even up to 2 weeks.  Keep the dressing clean, dry and intact.  Keep your foot elevated above the heart level for the first 48 hours.  Keep leg elevated after that time to ensure less swelling.  You may also put ice to your right foot for maximum 10 minutes out of every 1 hour as needed.  Try to stay off the right foot is much as possible.  You may put a little bit of weight on the heel inside the boot but you may not walk or put weight on the foot external to the boot.  You should also sleep in the boot on the right foot.  If you need to be up and moving for long distances recommend using crutches, knee scooter, or wheelchair.  Do not take a shower. Baths are permissible as long as the foot is kept out of the water.   Every hour you are awake:  Bend your knee 15 times. Massage calf 15 times  Call [facility] [phone]) if any of the following problems occur: You develop a temperature or fever. The bandage becomes saturated with blood. Medication does not stop your pain. Injury of the foot occurs. Any symptoms of infection including redness, odor, or red streaks running from wound.   PERIPHERAL NERVE BLOCK PATIENT INFORMATION  Your surgeon has requested a peripheral nerve block for your surgery. This anesthetic technique provides excellent post-operative pain relief for you in a safe and effective manner. It will also help reduce the risk of nausea and vomiting and allow earlier discharge from the hospital.    The block is performed under sedation with ultrasound guidance prior to your procedure. Due to the sedation, your may or may not remember the block experience. The nerve block will begin to take effect anywhere from 5 to 30 minutes after being administered. You will be transported to the operating room from your surgery after the block is completed.   At the end of surgery, when the anesthesia wears off, you will notice a few things. Your may not be able to move or feel the part of your body targeted by the nerve block. These are normal experiences, and they will disappear as the block wears off.  If you had an interscalene nerve block performed (which is common for shoulder surgery), your voice can be very hoarse and you may feel that you are not able to take as deep a breath as you did before surgery. Some patients may also notice a droopy eyelid on the affected side. These symptoms will resolve once the block wears off.  Pain control: The nerve block technique used is a single injection that can last anywhere from 1-3 days. The duration of the numbness can vary between individuals. After leaving the hospital, it is important that you begin to take your prescribed pain medication when you start to sense the nerve block wearing off. This will help you avoid unpleasant pain at the time the nerve block wears off, which can sometimes be in the middle of  the night. The block will only cover pain in the areas targeted by the nerve block so if you experience surgical pain outside of that area, please take your prescribed pain medication. Management of the numb area: After a nerve block, you cannot feel pain, pressure, or temperature in the affected area so there is an increased risk for injury. You should take extra care to protect the affected areas until sensation and movement returns. Please take caution to not come in contact with extremely hot or cold items because you will not be able to sense or protect  yourself form the extremes of temperature.  You may experience some persistent numbness after the procedure by most neurological deficits resolve over time and the incidence of serious long term neurological complications attributable to peripheral nerve blocks are relatively uncommon.    Information for Discharge Teaching: EXPAREL (bupivacaine liposome injectable suspension)   Your surgeon or anesthesiologist gave you EXPAREL(bupivacaine) to help control your pain after surgery.  EXPAREL is a local anesthetic that provides pain relief by numbing the tissue around the surgical site. EXPAREL is designed to release pain medication over time and can control pain for up to 72 hours. Depending on how you respond to EXPAREL, you may require less pain medication during your recovery.  Possible side effects: Temporary loss of sensation or ability to move in the area where bupivacaine was injected. Nausea, vomiting, constipation Rarely, numbness and tingling in your mouth or lips, lightheadedness, or anxiety may occur. Call your doctor right away if you think you may be experiencing any of these sensations, or if you have other questions regarding possible side effects.  Follow all other discharge instructions given to you by your surgeon or nurse. Eat a healthy diet and drink plenty of water or other fluids.  If you return to the hospital for any reason within 96 hours following the administration of EXPAREL, it is important for health care providers to know that you have received this anesthetic. A teal colored band has been placed on your arm with the date, time and amount of EXPAREL you have received in order to alert and inform your health care providers. Please leave this armband in place for the full 96 hours following administration, and then you may remove the band.

## 2021-03-12 ENCOUNTER — Ambulatory Visit: Payer: Self-pay

## 2021-03-12 ENCOUNTER — Encounter: Payer: Self-pay | Admitting: Podiatry

## 2021-03-12 ENCOUNTER — Ambulatory Visit
Admission: RE | Admit: 2021-03-12 | Discharge: 2021-03-12 | Disposition: A | Discharge status: Home / Self Care | Payer: Medicare Other | Attending: Podiatry | Admitting: Podiatry

## 2021-03-12 ENCOUNTER — Ambulatory Visit: Payer: Medicare Other | Admitting: Anesthesiology

## 2021-03-12 ENCOUNTER — Other Ambulatory Visit: Payer: Self-pay

## 2021-03-12 ENCOUNTER — Encounter
Admission: RE | Disposition: A | Discharge status: Home / Self Care | Payer: Self-pay | Source: Home / Self Care | Attending: Podiatry

## 2021-03-12 DIAGNOSIS — M2041 Other hammer toe(s) (acquired), right foot: Secondary | ICD-10-CM | POA: Diagnosis present

## 2021-03-12 DIAGNOSIS — I129 Hypertensive chronic kidney disease with stage 1 through stage 4 chronic kidney disease, or unspecified chronic kidney disease: Secondary | ICD-10-CM | POA: Insufficient documentation

## 2021-03-12 DIAGNOSIS — L570 Actinic keratosis: Secondary | ICD-10-CM | POA: Diagnosis not present

## 2021-03-12 DIAGNOSIS — M24574 Contracture, right foot: Secondary | ICD-10-CM | POA: Insufficient documentation

## 2021-03-12 DIAGNOSIS — N189 Chronic kidney disease, unspecified: Secondary | ICD-10-CM | POA: Insufficient documentation

## 2021-03-12 DIAGNOSIS — M069 Rheumatoid arthritis, unspecified: Secondary | ICD-10-CM | POA: Insufficient documentation

## 2021-03-12 HISTORY — PX: CORRECTION OVERLAPPING TOES: SHX6615

## 2021-03-12 HISTORY — PX: CAPSULOTOMY: SHX379

## 2021-03-12 HISTORY — DX: Rheumatoid arthritis, unspecified: M06.9

## 2021-03-12 SURGERY — CORRECTION, TOE, CROSSOVER
Anesthesia: General | Site: Foot | Laterality: Right

## 2021-03-12 MED ORDER — BUPIVACAINE HCL (PF) 0.5 % IJ SOLN
INTRAMUSCULAR | Status: DC | PRN
Start: 2021-03-12 — End: 2021-03-12
  Administered 2021-03-12: 20 mL via PERINEURAL

## 2021-03-12 MED ORDER — ONDANSETRON HCL 4 MG PO TABS: 4.0000 mg | ORAL_TABLET | Freq: Three times a day (TID) | ORAL | 0 refills | Status: DC | PRN

## 2021-03-12 MED ORDER — FENTANYL CITRATE PF 50 MCG/ML IJ SOSY: 25.0000 ug | PREFILLED_SYRINGE | INTRAMUSCULAR | Status: DC | PRN

## 2021-03-12 MED ORDER — DOXYCYCLINE MONOHYDRATE 100 MG PO TABS: 100.0000 mg | ORAL_TABLET | Freq: Two times a day (BID) | ORAL | 0 refills | Status: AC

## 2021-03-12 MED ORDER — ASPIRIN EC 325 MG PO TBEC: 325.0000 mg | DELAYED_RELEASE_TABLET | Freq: Every day | ORAL | 0 refills | Status: AC

## 2021-03-12 MED ORDER — ONDANSETRON HCL 4 MG/2ML IJ SOLN
INTRAMUSCULAR | Status: DC | PRN
  Administered 2021-03-12: 4 mg via INTRAVENOUS

## 2021-03-12 MED ORDER — LACTATED RINGERS IV SOLN: INTRAVENOUS | Status: DC

## 2021-03-12 MED ORDER — 0.9 % SODIUM CHLORIDE (POUR BTL) OPTIME
TOPICAL | Status: DC | PRN
  Administered 2021-03-12: 500 mL

## 2021-03-12 MED ORDER — BUPIVACAINE LIPOSOME 1.3 % IJ SUSP
INTRAMUSCULAR | Status: DC | PRN
Start: 2021-03-12 — End: 2021-03-12
  Administered 2021-03-12: 20 mL via PERINEURAL

## 2021-03-12 MED ORDER — FENTANYL CITRATE (PF) 100 MCG/2ML IJ SOLN
INTRAMUSCULAR | Status: DC | PRN
  Administered 2021-03-12: 50 ug via INTRAVENOUS

## 2021-03-12 MED ORDER — CLINDAMYCIN PHOSPHATE 900 MG/50ML IV SOLN
900.0000 mg | INTRAVENOUS | Status: AC
  Administered 2021-03-12: 900 mg via INTRAVENOUS

## 2021-03-12 MED ORDER — GLYCOPYRROLATE 0.2 MG/ML IJ SOLN
INTRAMUSCULAR | Status: DC | PRN
Start: 2021-03-12 — End: 2021-03-12
  Administered 2021-03-12: .2 mg via INTRAVENOUS

## 2021-03-12 MED ORDER — TRAMADOL HCL 100 MG PO TABS
100.0000 mg | ORAL_TABLET | Freq: Four times a day (QID) | ORAL | 0 refills | Status: DC | PRN
Start: 2021-03-12 — End: 2023-10-11

## 2021-03-12 MED ORDER — IBUPROFEN 200 MG PO TABS: 200.0000 mg | ORAL_TABLET | Freq: Four times a day (QID) | ORAL | Status: DC | PRN

## 2021-03-12 MED ORDER — IBUPROFEN 100 MG/5ML PO SUSP: 200.0000 mg | Freq: Four times a day (QID) | ORAL | Status: DC | PRN

## 2021-03-12 MED ORDER — ONDANSETRON HCL 4 MG/2ML IJ SOLN: 4.0000 mg | Freq: Once | INTRAMUSCULAR | Status: DC | PRN

## 2021-03-12 MED ORDER — MIDAZOLAM HCL 2 MG/2ML IJ SOLN
INTRAMUSCULAR | Status: DC | PRN
  Administered 2021-03-12: 1 mg via INTRAVENOUS

## 2021-03-12 MED ORDER — PROPOFOL 10 MG/ML IV BOLUS
INTRAVENOUS | Status: DC | PRN
Start: 2021-03-12 — End: 2021-03-12
  Administered 2021-03-12: 130 mg via INTRAVENOUS

## 2021-03-12 MED ORDER — DEXAMETHASONE SODIUM PHOSPHATE 4 MG/ML IJ SOLN
INTRAMUSCULAR | Status: DC | PRN
Start: 2021-03-12 — End: 2021-03-12
  Administered 2021-03-12: 4 mg via INTRAVENOUS

## 2021-03-12 MED ORDER — LIDOCAINE HCL (CARDIAC) PF 100 MG/5ML IV SOSY
PREFILLED_SYRINGE | INTRAVENOUS | Status: DC | PRN
  Administered 2021-03-12: 60 mg via INTRATRACHEAL

## 2021-03-12 SURGICAL SUPPLY — 36 items
BLADE SURG 15 STRL LF DISP TIS (BLADE) ×1 IMPLANT
BLADE SURG 15 STRL SS (BLADE) ×3
BNDG CMPR STD VLCR NS LF 5.8X4 (GAUZE/BANDAGES/DRESSINGS) ×2
BNDG CMPR STD VLCR NS LF 5.8X6 (GAUZE/BANDAGES/DRESSINGS) ×2
BNDG COHESIVE 4X5 TAN ST LF (GAUZE/BANDAGES/DRESSINGS) ×3 IMPLANT
BNDG ELASTIC 4X5.8 VLCR NS LF (GAUZE/BANDAGES/DRESSINGS) ×3 IMPLANT
BNDG ELASTIC 6X5.8 VLCR NS LF (GAUZE/BANDAGES/DRESSINGS) ×3 IMPLANT
BNDG ESMARK 4X12 TAN STRL LF (GAUZE/BANDAGES/DRESSINGS) ×3 IMPLANT
BNDG GAUZE ELAST 4 BULKY (GAUZE/BANDAGES/DRESSINGS) ×3 IMPLANT
BOOT STEPPER DURA SM (SOFTGOODS) ×2 IMPLANT
CANISTER SUCT 1200ML W/VALVE (MISCELLANEOUS) ×3 IMPLANT
COVER LIGHT HANDLE UNIVERSAL (MISCELLANEOUS) ×6 IMPLANT
CUFF TOURN SGL QUICK 18X4 (TOURNIQUET CUFF) ×2 IMPLANT
DRAPE FLUOR MINI C-ARM 54X84 (DRAPES) ×3 IMPLANT
DURAPREP 26ML APPLICATOR (WOUND CARE) ×3 IMPLANT
ELECT REM PT RETURN 9FT ADLT (ELECTROSURGICAL) ×3
ELECTRODE REM PT RTRN 9FT ADLT (ELECTROSURGICAL) ×2 IMPLANT
ETHIBOND 2 0 GREEN CT 2 30IN (SUTURE) ×2 IMPLANT
GAUZE SPONGE 4X4 12PLY STRL (GAUZE/BANDAGES/DRESSINGS) ×3 IMPLANT
GAUZE XEROFORM 1X8 LF (GAUZE/BANDAGES/DRESSINGS) ×3 IMPLANT
GLOVE SURG POLYISO LF SZ7 (GLOVE) ×6 IMPLANT
GLOVE SURG UNDER POLY LF SZ7 (GLOVE) ×3 IMPLANT
GOWN STRL REUS W/ TWL LRG LVL3 (GOWN DISPOSABLE) ×4 IMPLANT
GOWN STRL REUS W/TWL LRG LVL3 (GOWN DISPOSABLE) ×6
KIT TURNOVER KIT A (KITS) ×3 IMPLANT
NS IRRIG 500ML POUR BTL (IV SOLUTION) ×3 IMPLANT
PACK EXTREMITY ARMC (MISCELLANEOUS) ×3 IMPLANT
PADDING CAST BLEND 4X4 NS (MISCELLANEOUS) ×9 IMPLANT
SPLINT CAST 1 STEP 4X30 (MISCELLANEOUS) ×3 IMPLANT
STOCKINETTE IMPERVIOUS LG (DRAPES) ×3 IMPLANT
SUT ETHILON 3-0 FS-10 30 BLK (SUTURE) ×9
SUT VIC AB 3-0 SH 27 (SUTURE) ×6
SUT VIC AB 3-0 SH 27X BRD (SUTURE) ×2 IMPLANT
SUT VIC AB 4-0 FS2 27 (SUTURE) ×2 IMPLANT
SUTURE EHLN 3-0 FS-10 30 BLK (SUTURE) ×3 IMPLANT
SYSTEM IMPL FOREFOOT IB (Miscellaneous) ×4 IMPLANT

## 2021-03-12 NOTE — Anesthesia Postprocedure Evaluation (Signed)
Anesthesia Post Note  Patient: Jean Lawson  Procedure(s) Performed: CORRECTION OVERLAPPING TOES X 2 (3/4) (Right: Foot) CAPSULOTOMY MPJ X 1 (2) (Right: Foot)     Patient location during evaluation: PACU Anesthesia Type: General Level of consciousness: awake and alert and oriented Pain management: satisfactory to patient Vital Signs Assessment: post-procedure vital signs reviewed and stable Respiratory status: spontaneous breathing, nonlabored ventilation and respiratory function stable Cardiovascular status: blood pressure returned to baseline and stable Postop Assessment: Adequate PO intake and No signs of nausea or vomiting Anesthetic complications: no   No notable events documented.  Raliegh Ip

## 2021-03-12 NOTE — Anesthesia Procedure Notes (Signed)
Anesthesia Regional Block: Popliteal block   Pre-Anesthetic Checklist: , timeout performed,  Correct Patient, Correct Site, Correct Laterality,  Correct Procedure, Correct Position, site marked,  Risks and benefits discussed,  Surgical consent,  Pre-op evaluation,  At surgeon's request and post-op pain management  Laterality: Right  Prep: chloraprep       Needles:  Injection technique: Single-shot  Needle Type: Echogenic Needle     Needle Length: 9cm  Needle Gauge: 21     Additional Needles:   Procedures:,,,, ultrasound used (permanent image in chart),,    Narrative:  Start time: 03/12/2021 8:15 AM End time: 03/12/2021 8:25 AM Injection made incrementally with aspirations every 5 mL.  Performed by: Personally  Anesthesiologist: Ronelle Nigh, MD  Additional Notes: Functioning IV was confirmed and monitors applied. Ultrasound guidance: relevant anatomy identified, needle position confirmed, local anesthetic spread visualized around nerve(s)., vascular puncture avoided.  Image printed for medical record.  Negative aspiration and no paresthesias; incremental administration of local anesthetic. The patient tolerated the procedure well. Vitals signes recorded in RN notes. 54ml Bup, 15 ml Exparel

## 2021-03-12 NOTE — Anesthesia Procedure Notes (Signed)
Procedure Name: LMA Insertion Date/Time: 03/12/2021 9:23 AM Performed by: Dionne Bucy, CRNA Pre-anesthesia Checklist: Patient identified, Patient being monitored, Timeout performed, Emergency Drugs available and Suction available Patient Re-evaluated:Patient Re-evaluated prior to induction Oxygen Delivery Method: Circle system utilized Preoxygenation: Pre-oxygenation with 100% oxygen Induction Type: IV induction Ventilation: Mask ventilation without difficulty LMA: LMA inserted LMA Size: 4.0 Tube type: Oral Number of attempts: 1 Placement Confirmation: positive ETCO2 and breath sounds checked- equal and bilateral Tube secured with: Tape Dental Injury: Teeth and Oropharynx as per pre-operative assessment

## 2021-03-12 NOTE — Transfer of Care (Signed)
Immediate Anesthesia Transfer of Care Note  Patient: Jean Lawson  Procedure(s) Performed: CORRECTION OVERLAPPING TOES X 2 (3/4) (Right: Foot) CAPSULOTOMY MPJ X 1 (2) (Right: Foot)  Patient Location: PACU  Anesthesia Type: General LMA  Level of Consciousness: awake, alert  and patient cooperative  Airway and Oxygen Therapy: Patient Spontanous Breathing and Patient connected to supplemental oxygen  Post-op Assessment: Post-op Vital signs reviewed, Patient's Cardiovascular Status Stable, Respiratory Function Stable, Patent Airway and No signs of Nausea or vomiting  Post-op Vital Signs: Reviewed and stable  Complications: No notable events documented.

## 2021-03-12 NOTE — Anesthesia Preprocedure Evaluation (Signed)
Anesthesia Evaluation  Patient identified by MRN, date of birth, ID band Patient awake    Reviewed: Allergy & Precautions, H&P , NPO status , Patient's Chart, lab work & pertinent test results  Airway Mallampati: II  TM Distance: >3 FB Neck ROM: full    Dental no notable dental hx.    Pulmonary    Pulmonary exam normal breath sounds clear to auscultation       Cardiovascular hypertension, Normal cardiovascular exam Rhythm:regular Rate:Normal     Neuro/Psych    GI/Hepatic   Endo/Other  Hypothyroidism RA  Renal/GU      Musculoskeletal   Abdominal   Peds  Hematology   Anesthesia Other Findings   Reproductive/Obstetrics                             Anesthesia Physical Anesthesia Plan  ASA: 2  Anesthesia Plan: General LMA   Post-op Pain Management: Minimal or no pain anticipated and Regional block   Induction:   PONV Risk Score and Plan: 3 and Treatment may vary due to age or medical condition, Ondansetron, Dexamethasone and Midazolam  Airway Management Planned:   Additional Equipment:   Intra-op Plan:   Post-operative Plan:   Informed Consent: I have reviewed the patients History and Physical, chart, labs and discussed the procedure including the risks, benefits and alternatives for the proposed anesthesia with the patient or authorized representative who has indicated his/her understanding and acceptance.     Dental Advisory Given  Plan Discussed with: CRNA  Anesthesia Plan Comments:         Anesthesia Quick Evaluation

## 2021-03-12 NOTE — H&P (Signed)
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  The various methods of treatment have been discussed with the patient.  No guarantees were given.  After consideration of risks, benefits and other options for treatment, the patient has consented to surgery.  I have reviewed the patients chart and labs.    PROCEDURE: RIGHT HAMMER TOE REPAIR POSSIBLE 2,3,4 RIGHT POSSIBLE WEIL OSTEOTOMIES 2,3,4 LATERAL COLLATERAL LIGAMENT RECONSTRUCTION RIGHT 3-4 TOES POSSIBLE FLEXOR TENDON TRANSFER 2ND TOE RIGHT  A history and physical examination was performed in my office.  The patient was reexamined.  There have been no changes to this history and physical examination.  Caroline More, DPM

## 2021-03-12 NOTE — Op Note (Signed)
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  Large amount of scar tissue present over the right second, third, fourth metatarsal phalangeal joints from previous surgical intervention 2.  Extensor contracture right second metatarsal phalangeal joint 3.  Plantar medial deviation of third and fourth toes right foot  PATHOLOGY/SPECIMEN(S): None  INDICATIONS:   Jean Lawson is a 76 y.o. female who presents with a painful callus to the medial aspect of the right third toe with medial deviation and plantar deviation of third and fourth toes which causes pain and discomfort for patient.  Patient is previously had surgical intervention on the right forefoot consisting of bunion and hammertoe repairs.  Patient has noticed recurrence of hammertoe contractures over time.  Patient has exhausted conservative measures consisting of change in shoe gear, usage of toe spacers and silicone sleeves as well as orthoses and taping/padding.  All treatment options were discussed with the patient and patient's family both conservative and surgical attempts at correction including potential risks and complications and at this time patient is elected for surgery described above to the right forefoot..  DESCRIPTION: After obtaining full informed written consent, the patient  was brought back to the operating room and placed supine upon the operating table.  The patient received IV antibiotics prior to induction.  Patient received preoperative popliteal nerve block performed by anesthesia.  After obtaining adequate anesthesia, the patient was prepped and draped in the standard fashion.  An Esmarch bandage was used to exsanguinate the right lower extremity and the pneumatic ankle tourniquet was inflated.  Attention was then directed to the dorsal aspect of the right forefoot where a linear longitudinal incisions were made over the proximal phalanx bases of the third and fourth toes and the incisions were extended proximally to the metatarsal phalangeal joints.  The incisions were then curved to create more of a skin bridge between the incision such that the third metatarsal incision was directed more medially and extended over the second metatarsal and an S type of incision in a similar incision was performed over the fourth metatarsal phalangeal joint directing it more laterally into the fourth interspace/webspace area making an S type of incision.  The incisions were deepened to the subcutaneous tissue utilizing sharp and blunt dissection and care was taken to identify and retract all vital neurovascular structures no venous contributories were cauterized as necessary.  There appeared to be a large amount of scar tissue over the areas of the second, third, fourth metatarsal phalangeal joints from previous surgical intervention that was performed.  The extensor digitorum brevis tendons also appear to be transected.  At this time a Z-lengthening was performed of the third and fourth extensor digitorum longus tendons and the tendons were pulled back and retracted to visualize the third and fourth metatarsal phalangeal joints further.  These tendons were then held with hemostats to get them out of the way of the surgical sites.  At this time a capsular incision was  made into the third and  fourth metatarsal phalangeal joints and the capsular and periosteal tissue was reflected medially and laterally thereby exposing the joint surfaces at the operative sites.  The digits appeared to sit medially on the metatarsal phalangeal joints.  A McGlamary elevator was used to release any plantar contractures present.  The medial collateral ligament was also released at this time to reduce some of the medial contracture.  The toes still appear to slightly sit in a slightly plantarflexed position so at this time a flexor release was performed through a separate small stab incision at the lateral aspect of the third and fourth toes at the level of the proximal phalanx midshaft.  Once the flexor tendon area was released the toes appeared to sit in a more rectus position overall but still appeared to be somewhat medially deviated.  At this time the guidewire for the 2.5 Arthrex drill bit was then placed through the proximal phalanx base from dorsal medial to plantar proximal lateral at the third and fourth metatarsal phalangeal joints.  The 2.5 mm Arthrex drill bit was then used over the wire to drill a pilot hole in the area.  The instrumentation was removed and then a similar construct was then performed to the distal third and fourth metatarsals directing the guidewire through the dorsal lateral aspect of the distal metatarsal shaft through the medial aspect of the metatarsal distal shaft and neck area in a lateral plantar direction.  The instrumentation was once again removed.  Since the extensor digitorum brevis tendon was cut at this time it was determined to use FiberWire instead so #2 FiberWire was placed through the pilot holes for starting with the medial proximal phalanx base and entering out the lateral proximal phalanx base and then pulled through the lateral metatarsal head and then through the medial metatarsal head recreating the lateral collateral ligament.  The 3.0 x 8 mm tenodesis screw was then  placed into the medial proximal phalanx hole while holding some tension on the suture and while holding adequate tension and the toe in a rectus position the medial metatarsal hole was also filled with another 3.0 x 8 mm tenodesis screw from Arthrex.  The same procedure was performed on both the third and fourth metatarsal phalangeal joints to recreate the lateral collateral ligament.  The toe appeared to be sitting in a fairly rectus position overall but still appeared to be slightly plantarflexed at this time at the digit level at the metatarsal phalangeal joint likely due to the extensor releases which were performed.  The surgical sites were flushed with copious amounts normal sterile saline.  At this time the extensor tendons were then reapproximated but held in a tighter position to shorten the extensors to increase the dorsiflexor repull at the metatarsal phalangeal joints.  This was performed with 3-0 Vicryl and 2-0 Ethibond suture.  The toes appear to sit in a rectus position overall at this time after this was performed.  The dorsal metatarsal phalangeal joint capsules of the third and fourth metatarsal phalangeal joint reapproximated well coapted with 3-0 Vicryl.    The second digit appeared to be sitting slightly dorsal compared to the third and fourth toes still so at this time it was determined to perform an extensor lengthening and capsulotomy dorsally to the second metatarsal phalangeal joint which was performed through the incision that was made at the third metatarsal phalangeal joint that was directed more medially.  Once this was performed the second toe appeared  to sit in a rectus position along with the third and fourth toes.  The surgical site was once again flushed with copious amounts normal sterile saline.  The pneumatic ankle tourniquet was deflated and a prompt Ibuprin spots was noted all digits of the right foot and the skin bridge between the incisions appeared to be intact with  capillary fill time intact.  The subcutaneous tissue was reapproximated well coapted with 4-0 Vicryl and the skin was reapproximated well coapted with combination of horizontal mattress and simple type stitching with 3-0 nylon.  The 2 small stab incisions that were made for the flexor tendon releases at the lateral aspects of the third and fourth toes reapproximated well coapted with 3-0 nylon.  A postoperative dressing was then applied consisting of Xeroform to the incision lines followed by 4 x 4 gauze splinting the toes in appropriate position holding them in a rectus position, Kling, Kerlix, web roll, Ace wrap and tall cam boot.  Patient was transferred to recovery room vital signs stable vascular status intact to all toes of the right foot.  The patient tolerated the procedure and anesthesia well.  Patient will be discharged with the appropriate orders, instructions, medications.  Patient instructed to try to stay off the right foot is much as possible and only is allowed to place foot on the ground with heel contact.  Patient is going to be up and moving for long distances patient should be wearing the boot at all times and also using a knee scooter, crutches, or wheelchair to stay off the foot.  Patient should also keep the boot on when sleeping.  Discussed with husband and patient preoperatively and postoperatively.  Patient to follow-up in clinic within 1 week of surgical date.  COMPLICATIONS: None  CONDITION: Good, stable  Caroline More, DPM

## 2021-03-12 NOTE — Anesthesia Procedure Notes (Signed)
Anesthesia Regional Block: Adductor canal block   Pre-Anesthetic Checklist: , timeout performed,  Correct Patient, Correct Site, Correct Laterality,  Correct Procedure, Correct Position, site marked,  Risks and benefits discussed,  Surgical consent,  Pre-op evaluation,  At surgeon's request and post-op pain management  Laterality: Right  Prep: chloraprep       Needles:  Injection technique: Single-shot  Needle Type: Echogenic Needle     Needle Length: 9cm  Needle Gauge: 21     Additional Needles:   Procedures:,,,, ultrasound used (permanent image in chart),,    Narrative:  Start time: 03/12/2021 8:15 AM End time: 03/12/2021 8:25 AM Injection made incrementally with aspirations every 5 mL.  Performed by: Personally  Anesthesiologist: Ronelle Nigh, MD  Additional Notes: Functioning IV was confirmed and monitors applied. Ultrasound guidance: relevant anatomy identified, needle position confirmed, local anesthetic spread visualized around nerve(s)., vascular puncture avoided.  Image printed for medical record.  Negative aspiration and no paresthesias; incremental administration of local anesthetic. The patient tolerated the procedure well. Vitals signes recorded in RN notes.  27ml Bupi, 6ml exparel

## 2021-03-13 ENCOUNTER — Encounter: Payer: Self-pay | Admitting: Podiatry

## 2021-04-23 ENCOUNTER — Other Ambulatory Visit: Payer: Self-pay

## 2021-04-23 ENCOUNTER — Ambulatory Visit (INDEPENDENT_AMBULATORY_CARE_PROVIDER_SITE_OTHER): Payer: Medicare Other | Admitting: Dermatology

## 2021-04-23 DIAGNOSIS — L720 Epidermal cyst: Secondary | ICD-10-CM

## 2021-04-23 NOTE — Progress Notes (Signed)
° °  Follow-Up Visit   Subjective  Jean Lawson is a 76 y.o. female who presents for the following: Follow-up (Patient here today concerning a cyst at left side of face. Patient reports that it has grown larger and would to discuss removal. ). The following portions of the chart were reviewed this encounter and updated as appropriate:  Tobacco   Allergies   Meds   Problems   Med Hx   Surg Hx   Fam Hx       Objective  Well appearing patient in no apparent distress; mood and affect are within normal limits.  A focused examination was performed including left side of face. Relevant physical exam findings are noted in the Assessment and Plan.  left cheek 2 x 1.5 cm cystic papule       Assessment & Plan  Epidermal inclusion cyst left cheek Has grown larger Cyst with symptoms and/or recent change.  Discussed surgical excision to remove, including resulting scar and possible recurrence.  Patient will schedule for surgery. Pre-op information given.  Patient will schedule surgery appointment   Return for excision for cyst removal at left cheek . IRuthell Rummage, CMA, am acting as scribe for Sarina Ser, MD. Documentation: I have reviewed the above documentation for accuracy and completeness, and I agree with the above.  Sarina Ser, MD

## 2021-04-23 NOTE — Patient Instructions (Addendum)
Pre-Operative Instructions  You are scheduled for a surgical procedure at [facility]. We recommend you read the following instructions. If you have any questions or concerns, please call the office at [phone].  Shower and wash the entire body with soap and water the day of your surgery paying special attention to cleansing at and around the planned surgery site.  Avoid aspirin or aspirin containing products at least fourteen (14) days prior to your surgical procedure and for at least one week (7 Days) after your surgical procedure. If you take aspirin on a regular basis for heart disease or history of stroke or for any other reason, we may recommend you continue taking aspirin but please notify us if you take this on a regular basis. Aspirin can cause more bleeding to occur during surgery as well as prolonged bleeding and bruising after surgery.   Avoid other nonsteroidal pain medications at least one week prior to surgery and at least one week prior to your surgery. These include medications such as Ibuprofen (Motrin, Advil and Nuprin), Naprosyn, Voltaren, Relafen, etc. If medications are used for therapeutic reasons, please inform us as they can cause increased bleeding or prolonged bleeding during and bruising after surgical procedures.   Please advise [place] if you are taking any "blood thinner" medications such as Coumadin or Dipyridamole or Plavix or similar medications. These cause increased bleeding and prolonged bleeding during procedures and bruising after surgical procedures. We may have to consider discontinuing these medications briefly prior to and shortly after your surgery if safe to do so.   Please inform us of all medications you are currently taking. All medications that are taken regularly should be taken the day of surgery as you always do. Nevertheless, we need to be informed of what medications you are taking prior to surgery to know whether they will affect the  procedure or cause any complications.   Please inform us of any medication allergies. Also inform us of whether you have allergies to Latex or rubber products or whether you have had any adverse reaction to Lidocaine or Epinephrine.  Please inform us of any prosthetic or artificial body parts such as artificial heart valve, joint replacements, etc., or similar condition that might require preoperative antibiotics.   We recommend avoidance of alcohol at least two weeks prior to surgery and continued avoidance for at least two weeks after surgery.   We recommend discontinuation of tobacco smoking at least two weeks prior to surgery and continued abstinence for at least two weeks after surgery.  Do not plan strenuous exercise, strenuous work or strenuous lifting for approximately four weeks after your surgery.   We request if you are unable to make your scheduled surgical appointment, please call us at least a week in advance or as soon as you are aware of a problem so that we can cancel or reschedule the appointment.   You MAY TAKE TYLENOL (acetaminophen) for pain as it is not a blood thinner.   PLEASE PLAN TO BE IN TOWN FOR TWO WEEKS FOLLOWING SURGERY, THIS IS IMPORTANT SO YOU CAN BE CHECKED FOR DRESSING CHANGES, SUTURE REMOVAL AND TO MONITOR FOR POSSIBLE COMPLICATIONS.           If You Need Anything After Your Visit  If you have any questions or concerns for your doctor, please call our main line at [phone] and press option 4 to reach your doctor's medical assistant. If no one answers, please leave a voicemail as directed and  we will return your call as soon as possible. Messages left after 4 pm will be answered the following business day.   You may also send [place] a message via [place]. We typically respond to MyChart messages within 1-2 business days.  For prescription refills, please ask your pharmacy to contact our office. Our fax number is [phone].  If you have an urgent  issue when the clinic is closed that cannot wait until the next business day, you can page your doctor at the number below.    Please note that while we do our best to be available for urgent issues outside of office hours, we are not available 24/7.   If you have an urgent issue and are unable to reach [place], you may choose to seek medical care at your doctor's office, retail clinic, urgent care center, or emergency room.  If you have a medical emergency, please immediately call 911 or go to the emergency department.  Pager Numbers  - Dr. Nehemiah Massed: [phone]  - Dr. Laurence Ferrari: [phone]  - Dr. Nicole Kindred: [phone]  In the event of inclement weather, please call our main line at [phone] for an update on the status of any delays or closures.  Dermatology Medication Tips: Please keep the boxes that topical medications come in in order to help keep track of the instructions about where and how to use these. Pharmacies typically print the medication instructions only on the boxes and not directly on the medication tubes.   If your medication is too expensive, please contact our office at [phone] option 4 or send [place] a message through [place].   We are unable to tell what your co-pay for medications will be in advance as this is different depending on your insurance coverage. However, we may be able to find a substitute medication at lower cost or fill out paperwork to get insurance to cover a needed medication.   If a prior authorization is required to get your medication covered by your insurance company, please allow [place] 1-2 business days to complete this process.  Drug prices often vary depending on where the prescription is filled and some pharmacies may offer cheaper prices.  The website www.goodrx.com contains coupons for medications through different pharmacies. The prices here do not account for what the cost may be with help from insurance (it may be cheaper with your  insurance), but the website can give you the price if you did not use any insurance.  - You can print the associated coupon and take it with your prescription to the pharmacy.  - You may also stop by our office during regular business hours and pick up a GoodRx coupon card.  - If you need your prescription sent electronically to a different pharmacy, notify our office through [facility] or by phone at [phone] option 4.     Si Usted Necesita Algo Despus de Su Visita  Tambin puede enviarnos un mensaje a travs de Pharmacist, community. Por lo general respondemos a los mensajes de MyChart en el transcurso de 1 a 2 das hbiles.  Para renovar recetas, por favor pida a su farmacia que se ponga en contacto con nuestra oficina. Harland Dingwall de fax es [place] [phone].  Si tiene un asunto urgente cuando la clnica est cerrada y que no puede esperar hasta el siguiente da hbil, puede llamar/localizar a su doctor(a) al nmero que aparece a continuacin.   Por favor, tenga en cuenta que aunque hacemos todo lo posible para estar disponibles  para asuntos urgentes fuera del horario de [place], no estamos disponibles las 24 horas del da, los 7 das de la [place].   Si tiene un problema urgente y no puede comunicarse con nosotros, puede optar por buscar atencin mdica  en el consultorio de su doctor(a), en una clnica privada, en un centro de atencin urgente o en una sala de emergencias.  Si tiene Engineering geologist, por favor llame inmediatamente al 911 o vaya a la sala de emergencias.  Nmeros de bper  - Dr. Nehemiah Massed: [phone]  - Dra. Moye: [phone]  - Dra. Nicole Kindred: [phone]  En caso de inclemencias del [place], por favor llame a Johnsie Kindred principal al [phone] para una actualizacin sobre el [place] de cualquier retraso o cierre.  Consejos para la medicacin en dermatologa: Por favor, guarde las cajas en las que vienen los medicamentos de uso tpico para ayudarle a  seguir las instrucciones sobre dnde y cmo usarlos. Las farmacias generalmente imprimen las instrucciones del medicamento slo en las cajas y no directamente en los tubos del [place].   Si su medicamento es muy caro, por favor, pngase en contacto con Zigmund Daniel llamando al [phone] y presione la opcin 4 o envenos un mensaje a travs de Pharmacist, community.   No podemos decirle cul ser su copago por los medicamentos por adelantado ya que esto es diferente dependiendo de la cobertura de su seguro. Sin embargo, es posible que podamos encontrar un medicamento sustituto a Electrical engineer un formulario para que el seguro cubra el medicamento que se considera necesario.   Si se requiere una autorizacin previa para que su compaa de seguros [place] su medicamento, por favor permtanos de 1 a 2 das hbiles para completar este proceso.  Los precios de los medicamentos varan con frecuencia dependiendo del Environmental consultant de dnde se surte la receta y alguna farmacias pueden ofrecer precios ms baratos.  El sitio web www.goodrx.com tiene cupones para medicamentos de Airline pilot. Los precios aqu no tienen en cuenta lo que podra costar con la ayuda del seguro (puede ser ms barato con su seguro), pero el sitio web puede darle el precio si no utiliz Research scientist (physical sciences).  - Puede imprimir el cupn correspondiente y llevarlo con su receta a la farmacia.  - Tambin puede pasar por nuestra oficina durante el horario de atencin regular y Charity fundraiser una tarjeta de cupones de GoodRx.  - Si necesita que su receta se enve electrnicamente a una farmacia diferente, informe a nuestra oficina a travs de MyChart de [place] o por telfono llamando al [phone] y presione la opcin 4.

## 2021-04-25 ENCOUNTER — Encounter: Payer: Self-pay | Admitting: Dermatology

## 2021-05-27 ENCOUNTER — Encounter: Payer: Self-pay | Admitting: Dermatology

## 2021-06-30 ENCOUNTER — Ambulatory Visit (INDEPENDENT_AMBULATORY_CARE_PROVIDER_SITE_OTHER): Payer: Medicare Other | Admitting: Dermatology

## 2021-06-30 DIAGNOSIS — L720 Epidermal cyst: Secondary | ICD-10-CM

## 2021-06-30 DIAGNOSIS — D485 Neoplasm of uncertain behavior of skin: Secondary | ICD-10-CM

## 2021-06-30 MED ORDER — MUPIROCIN 2 % EX OINT: 1.0000 "application " | TOPICAL_OINTMENT | Freq: Every day | CUTANEOUS | 1 refills | Status: DC

## 2021-06-30 NOTE — Progress Notes (Signed)
? ?  Follow-Up Visit ?  ?Subjective  ?Jean Lawson is a 76 y.o. female who presents for the following: Cyst (Vs other of left cheek - Excise today). ? ?The following portions of the chart were reviewed this encounter and updated as appropriate:  ? Tobacco  Allergies  Meds  Problems  Med Hx  Surg Hx  Fam Hx   ?  ?Review of Systems:  No other skin or systemic complaints except as noted in HPI or Assessment and Plan. ? ?Objective  ?Well appearing patient in no apparent distress; mood and affect are within normal limits. ? ?A focused examination was performed including face. Relevant physical exam findings are noted in the Assessment and Plan. ? ?Left cheek ?Cystic papule 2.5 x 1.5cm ? ? ?Assessment & Plan  ?Neoplasm of uncertain behavior of skin ?Left cheek ? ?Skin excision ? ?Lesion length (cm):  2.5 ?Lesion width (cm):  1.5 ?Margin per side (cm):  0 ?Total excision diameter (cm):  2.5 ?Informed consent: discussed and consent obtained   ?Timeout: patient name, date of birth, surgical site, and procedure verified   ?Procedure prep:  Patient was prepped and draped in usual sterile fashion ?Prep type:  Isopropyl alcohol and povidone-iodine ?Anesthesia: the lesion was anesthetized in a standard fashion   ?Anesthetic:  1% lidocaine w/ epinephrine 1-100,000 buffered w/ 8.4% NaHCO3 (6cc lido w/ epi) ?Instrument used comment:  #15c blade ?Hemostasis achieved with: pressure   ?Hemostasis achieved with comment:  Electrocautery ?Outcome: patient tolerated procedure well with no complications   ?Post-procedure details: sterile dressing applied and wound care instructions given   ?Dressing type: bandage, pressure dressing and bacitracin (mupirocin)   ? ?Skin repair ?Complexity:  Complex ?Final length (cm):  2.7 ?Reason for type of repair: reduce tension to allow closure, reduce the risk of dehiscence, infection, and necrosis, reduce subcutaneous dead space and avoid a hematoma, allow closure of the large defect, preserve  normal anatomy, preserve normal anatomical and functional relationships and enhance both functionality and cosmetic results   ?Undermining: area extensively undermined   ?Undermining comment:  Undermining defect 1.5cm ?Subcutaneous layers (deep stitches):  ?Suture size:  5-0 ?Suture type: Vicryl (polyglactin 910)   ?Subcutaneous suture technique: inverted dermal. ?Fine/surface layer approximation (top stitches):  ?Suture size:  5-0 ?Suture type: nylon   ?Stitches: horizontal mattress   ?Suture removal (days):  7 ?Hemostasis achieved with: suture and pressure ?Outcome: patient tolerated procedure well with no complications   ?Post-procedure details: sterile dressing applied and wound care instructions given   ?Dressing type: bandage and pressure dressing (mupirocin)   ? ?mupirocin ointment (BACTROBAN) 2 % ?Apply 1 application. topically daily. Qd to excision site ? ?Specimen 1 - Surgical pathology ?Differential Diagnosis: Cyst vs other  ?Check Margins: No ?Cystic papule 2.5 x 1.5cm ? ?Cyst vs other excised today ?Start Mupirocin oint qd to wound ? ? ?Return in about 1 week (around 07/07/2021) for suture removal. ? ?I, Othelia Pulling, RMA, am acting as scribe for Sarina Ser, MD . ?Documentation: I have reviewed the above documentation for accuracy and completeness, and I agree with the above. ? ?Sarina Ser, MD ? ?

## 2021-06-30 NOTE — Patient Instructions (Signed)
Wound Care Instructions ? ?On the day following your surgery, you should begin doing daily dressing changes: ?Remove the old dressing and discard it. ?Cleanse the wound gently with tap water. This may be done in the shower or by placing a wet gauze pad directly on the wound and letting it soak for several minutes. ?It is important to gently remove any dried blood from the wound in order to encourage healing. This may be done by gently rolling a moistened Q-tip on the dried blood. Do not pick at the wound. ?If the wound should start to bleed, continue cleaning the wound, then place a moist gauze pad on the wound and hold pressure for a few minutes.  ?Make sure you then dry the skin surrounding the wound completely or the tape will not stick to the skin. Do not use cotton balls on the wound. ?After the wound is clean and dry, apply the ointment gently with a Q-tip. ?Cut a non-stick pad to fit the size of the wound. Lay the pad flush to the wound. If the wound is draining, you may want to reinforce it with a small amount of gauze on top of the non-stick pad for a little added compression to the area. ?Use the tape to seal the area completely. ?Select from the following with respect to your individual situation: ?If your wound has been stitched closed: continue the above steps 1-8 at least daily until your sutures are removed. ?If your wound has been left open to heal: continue steps 1-8 at least daily for the first 3-4 weeks. ?We would like for you to take a few extra precautions for at least the next week. ?Sleep with your head elevated on pillows if our wound is on your head. ?Do not bend over or lift heavy items to reduce the chance of elevated blood pressure to the wound ?Do not participate in particularly strenuous activities. ? ? ?Below is a list of dressing supplies you might need.  ?Cotton-tipped applicators - Q-tips ?Gauze pads (2x2 and/or 4x4) - All-Purpose Sponges ?Non-stick dressing material - Telfa ?Tape -  Paper or Hypafix ?New and clean tube of petroleum jelly - Vaseline  ? ? ?Comments on Post-Operative Period ?Slight swelling and redness often appear around the wound. This is normal and will disappear within several days following the surgery. ?The healing wound will drain a brownish-red-yellow discharge during healing. This is a normal phase of wound healing. As the wound begins to heal, the drainage may increase in amount. Again, this drainage is normal. ?Notify us if the drainage becomes persistently bloody, excessively swollen, or intensely painful or develops a foul odor or red streaks.  ?If you should experience mild discomfort during the healing phase, you may take an aspirin-free medication such as Tylenol (acetaminophen). Notify us if the discomfort is severe or persistent. Avoid alcoholic beverages when taking pain medicine. ? ?In Case of Wound Hemorrhage ?A wound hemorrhage is when the bandage suddenly becomes soaked with bright red blood and flows profusely. If this happens, sit down or lie down with your head elevated. If the wound has a dressing on it, do not remove the dressing. Apply pressure to the existing gauze. If the wound is not covered, use a gauze pad to apply pressure and continue applying the pressure for 20 minutes without peeking. DO NOT COVER THE WOUND WITH A LARGE TOWEL OR WASH CLOTH. Release your hand from the wound site but do not remove the dressing. If the bleeding has stopped,   gently clean around the wound. Leave the dressing in place for 24 hours if possible. This wait time allows the blood vessels to close off so that you do not spark a new round of bleeding by disrupting the newly clotted blood vessels with an immediate dressing change. If the bleeding does not subside, continue to hold pressure. If matters are out of your control, contact an After Hours clinic or go to the Emergency Room. ? ? ? ?If You Need Anything After Your Visit ? ?If you have any questions or concerns for  your doctor, please call our main line at [phone] and press option 4 to reach your doctor's medical assistant. If no one answers, please leave a voicemail as directed and we will return your call as soon as possible. Messages left after 4 pm will be answered the following business day.  ? ?You may also send [place] a message via MyChart. We typically respond to MyChart messages within 1-2 business days. ? ?For prescription refills, please ask your pharmacy to contact our office. Our fax number is [phone]. ? ?If you have an urgent issue when the clinic is closed that cannot wait until the next business day, you can page your doctor at the number below.   ? ?Please note that while we do our best to be available for urgent issues outside of office hours, we are not available 24/7.  ? ?If you have an urgent issue and are unable to reach [place], you may choose to seek medical care at your doctor's office, retail clinic, urgent care center, or emergency room. ? ?If you have a medical emergency, please immediately call 911 or go to the emergency department. ? ?Pager Numbers ? ?- Dr. Nehemiah Massed: [phone] ? ?- Dr. Laurence Ferrari: [phone] ? ?- Dr. Nicole Kindred: [phone] ? ?In the event of inclement weather, please call our main line at [phone] for an update on the status of any delays or closures. ? ?Dermatology Medication Tips: ?Please keep the boxes that topical medications come in in order to help keep track of the instructions about where and how to use these. Pharmacies typically print the medication instructions only on the boxes and not directly on the medication tubes.  ? ?If your medication is too expensive, please contact our office at [phone] option 4 or send [place] a message through [place].  ? ?We are unable to tell what your co-pay for medications will be in advance as this is different depending on your insurance coverage. However, we may be able to find a substitute medication at lower cost or fill out  paperwork to get insurance to cover a needed medication.  ? ?If a prior authorization is required to get your medication covered by your insurance company, please allow [place] 1-2 business days to complete this process. ? ?Drug prices often vary depending on where the prescription is filled and some pharmacies may offer cheaper prices. ? ?The website www.goodrx.com contains coupons for medications through different pharmacies. The prices here do not account for what the cost may be with help from insurance (it may be cheaper with your insurance), but the website can give you the price if you did not use any insurance.  ?- You can print the associated coupon and take it with your prescription to the pharmacy.  ?- You may also stop by our office during regular business hours and pick up a GoodRx coupon card.  ?- If you need your prescription sent electronically to a different pharmacy, notify our  office through [facility] or by phone at [phone] option 4. ? ? ? ? ?Si Usted Necesita Algo Despu?s de Su Visita ? ?Tambi?n puede enviarnos un mensaje a trav?s de MyChart. Por lo general respondemos a los mensajes de MyChart en el transcurso de 1 a 2 d?as h?biles. ? ?Para renovar recetas, por favor pida a su farmacia que se ponga en contacto con nuestra oficina. Nuestro n?mero de fax es el [phone]. ? ?Si tiene un asunto urgente cuando la cl?nica est? cerrada y que no puede esperar hasta el siguiente d?a h?bil, puede llamar/localizar a su doctor(a) al n?mero que aparece a continuaci?n.  ? ?Por favor, tenga en cuenta que aunque hacemos todo lo posible para estar disponibles para asuntos urgentes fuera del horario de oficina, no estamos disponibles las 24 horas del d?a, los 7 d?as de la semana.  ? ?Si tiene un problema urgente y no puede comunicarse con nosotros, puede optar por buscar atenci?n m?dica  en el consultorio de su doctor(a), en una cl?nica privada, en un centro de atenci?n urgente o en una sala de  emergencias. ? ?Si tiene Engineer, maintenance (IT) m?dica, por favor llame inmediatamente al 911 o vaya a la sala de emergencias. ? ?N?meros de b?per ? ?- Dr. Nehemiah Massed: [phone] ? ?- Dra. Moye: [phone] ? ?- Dra

## 2021-07-01 ENCOUNTER — Telehealth: Payer: Self-pay

## 2021-07-01 NOTE — Telephone Encounter (Signed)
Pt doing fine after yesterdays surgery./sh 

## 2021-07-07 ENCOUNTER — Ambulatory Visit (INDEPENDENT_AMBULATORY_CARE_PROVIDER_SITE_OTHER): Payer: Medicare Other | Admitting: Dermatology

## 2021-07-07 ENCOUNTER — Encounter: Payer: Self-pay | Admitting: Dermatology

## 2021-07-07 DIAGNOSIS — L814 Other melanin hyperpigmentation: Secondary | ICD-10-CM | POA: Diagnosis not present

## 2021-07-07 DIAGNOSIS — L570 Actinic keratosis: Secondary | ICD-10-CM

## 2021-07-07 DIAGNOSIS — L821 Other seborrheic keratosis: Secondary | ICD-10-CM | POA: Diagnosis not present

## 2021-07-07 DIAGNOSIS — Z4802 Encounter for removal of sutures: Secondary | ICD-10-CM

## 2021-07-07 DIAGNOSIS — L820 Inflamed seborrheic keratosis: Secondary | ICD-10-CM

## 2021-07-07 DIAGNOSIS — L578 Other skin changes due to chronic exposure to nonionizing radiation: Secondary | ICD-10-CM

## 2021-07-07 DIAGNOSIS — L720 Epidermal cyst: Secondary | ICD-10-CM

## 2021-07-07 NOTE — Patient Instructions (Signed)

## 2021-07-07 NOTE — Progress Notes (Signed)
? ?Follow-Up Visit ?  ?Subjective  ?Jean Lawson is a 76 y.o. female who presents for the following: Suture removal (Bx proven cyst of the L cheek ). Patient would like her face checked for precancerous skin lesions.  ?The patient has spots, moles and lesions to be evaluated, some may be new or changing and the patient has concerns that these could be cancer. ? ?The following portions of the chart were reviewed this encounter and updated as appropriate:  ? Tobacco  Allergies  Meds  Problems  Med Hx  Surg Hx  Fam Hx   ?  ?Review of Systems:  No other skin or systemic complaints except as noted in HPI or Assessment and Plan. ? ?Objective  ?Well appearing patient in no apparent distress; mood and affect are within normal limits. ? ?A focused examination was performed including the face. Relevant physical exam findings are noted in the Assessment and Plan. ? ?L cheek ?Healing excision site. ? ?L nose x 1 ?Erythematous thin papules/macules with gritty scale.  ? ?R inf cheek x 1 ?Erythematous stuck-on, waxy papule or plaque ? ? ?Assessment & Plan  ?Epidermal inclusion cyst ?L cheek ?Encounter for Removal of Sutures ?- Incision site at the L cheek is clean, dry and intact ?- Wound cleansed, sutures removed, wound cleansed and steri strips applied.  ?- Discussed pathology results showing a benign cyst.  ?- Patient advised to keep steri-strips dry until they fall off. ?- Scars remodel for a full year. ?- Once steri-strips fall off, patient can apply over-the-counter silicone scar cream each night to help with scar remodeling if desired. ?- Patient advised to call with any concerns or if they notice any new or changing lesions. ? ?AK (actinic keratosis) ?L nose x 1 ?Destruction of lesion - L nose x 1 ?Complexity: simple   ?Destruction method: cryotherapy   ?Informed consent: discussed and consent obtained   ?Timeout:  patient name, date of birth, surgical site, and procedure verified ?Lesion destroyed using liquid  nitrogen: Yes   ?Region frozen until ice ball extended beyond lesion: Yes   ?Outcome: patient tolerated procedure well with no complications   ?Post-procedure details: wound care instructions given   ? ?Inflamed seborrheic keratosis ?R inf cheek x 1 ?Destruction of lesion - R inf cheek x 1 ?Complexity: simple   ?Destruction method: cryotherapy   ?Informed consent: discussed and consent obtained   ?Timeout:  patient name, date of birth, surgical site, and procedure verified ?Lesion destroyed using liquid nitrogen: Yes   ?Region frozen until ice ball extended beyond lesion: Yes   ?Outcome: patient tolerated procedure well with no complications   ?Post-procedure details: wound care instructions given   ? ?Actinic Damage ?- chronic, secondary to cumulative UV radiation exposure/sun exposure over time ?- diffuse scaly erythematous macules with underlying dyspigmentation ?- Recommend daily broad spectrum sunscreen SPF 30+ to sun-exposed areas, reapply every 2 hours as needed.  ?- Recommend staying in the shade or wearing long sleeves, sun glasses (UVA+UVB protection) and wide brim hats (4-inch brim around the entire circumference of the hat). ?- Call for new or changing lesions. ? ?Seborrheic Keratoses ?- Stuck-on, waxy, tan-brown papules and/or plaques  ?- Benign-appearing ?- Discussed benign etiology and prognosis. ?- Observe ?- Call for any changes ? ?Lentigines ?- Scattered tan macules ?- Due to sun exposure ?- Benign-appering, observe ?- Recommend daily broad spectrum sunscreen SPF 30+ to sun-exposed areas, reapply every 2 hours as needed. ?- Call for any changes ? ?Return  in about 1 year (around 07/08/2022) for sun exposed areas check - hx of AK's . ? ?IRudell Cobb, CMA, am acting as scribe for Sarina Ser, MD . ?Documentation: I have reviewed the above documentation for accuracy and completeness, and I agree with the above. ? ?Sarina Ser, MD ? ?

## 2021-07-15 ENCOUNTER — Encounter: Payer: Self-pay | Admitting: Dermatology

## 2021-08-06 ENCOUNTER — Other Ambulatory Visit: Payer: Self-pay | Admitting: Internal Medicine

## 2021-08-06 DIAGNOSIS — Z1231 Encounter for screening mammogram for malignant neoplasm of breast: Secondary | ICD-10-CM

## 2021-09-07 ENCOUNTER — Ambulatory Visit
Admission: RE | Admit: 2021-09-07 | Discharge: 2021-09-07 | Disposition: A | Discharge status: Home / Self Care | Payer: Medicare Other | Source: Ambulatory Visit | Attending: Internal Medicine | Admitting: Internal Medicine

## 2021-09-07 DIAGNOSIS — Z1231 Encounter for screening mammogram for malignant neoplasm of breast: Secondary | ICD-10-CM | POA: Diagnosis present

## 2022-01-20 IMAGING — MG MM DIGITAL SCREENING BILAT W/ TOMO AND CAD
6 of 10 series · 6 of 30 positions shown · non-contrast
Comparison: Previous exam(s).

CLINICAL DATA: Screening.

EXAM:
DIGITAL SCREENING BILATERAL MAMMOGRAM WITH TOMOSYNTHESIS AND CAD
TECHNIQUE: Bilateral screening digital craniocaudal and mediolateral oblique
mammograms were obtained. Bilateral screening digital breast
tomosynthesis was performed. The images were evaluated with
computer-aided detection.

[R CC synth-2D (1 of 2)]
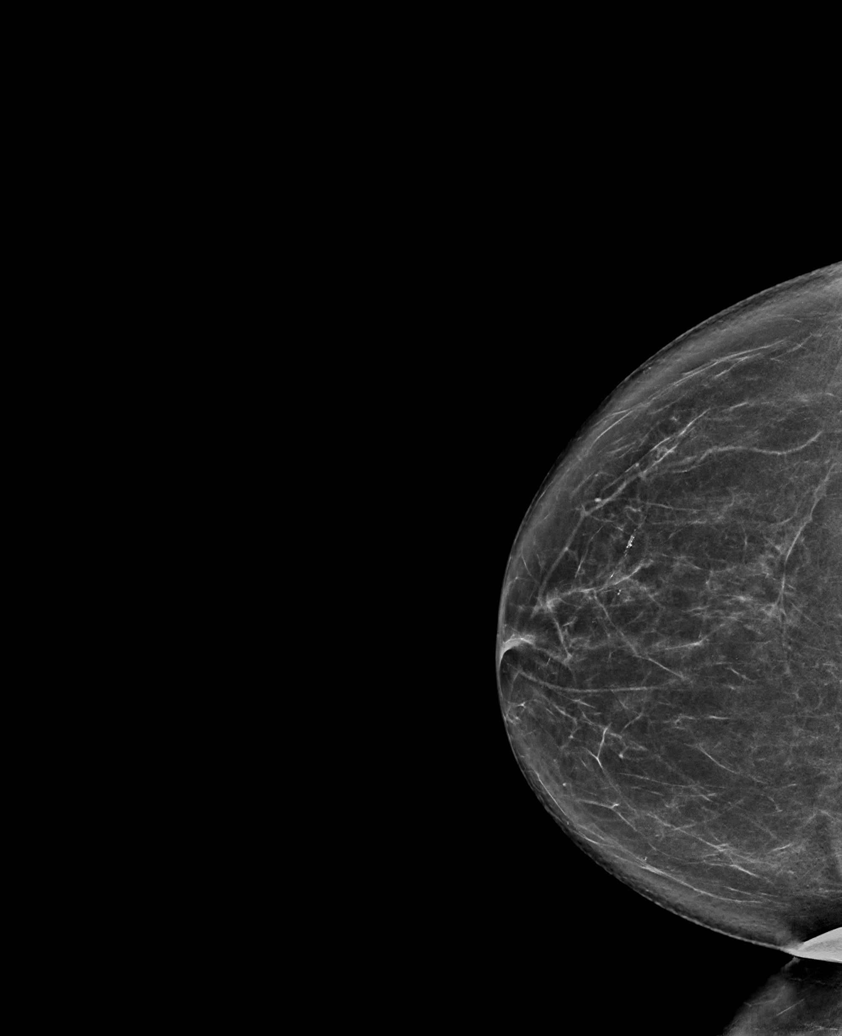

[L MLO synth-2D]
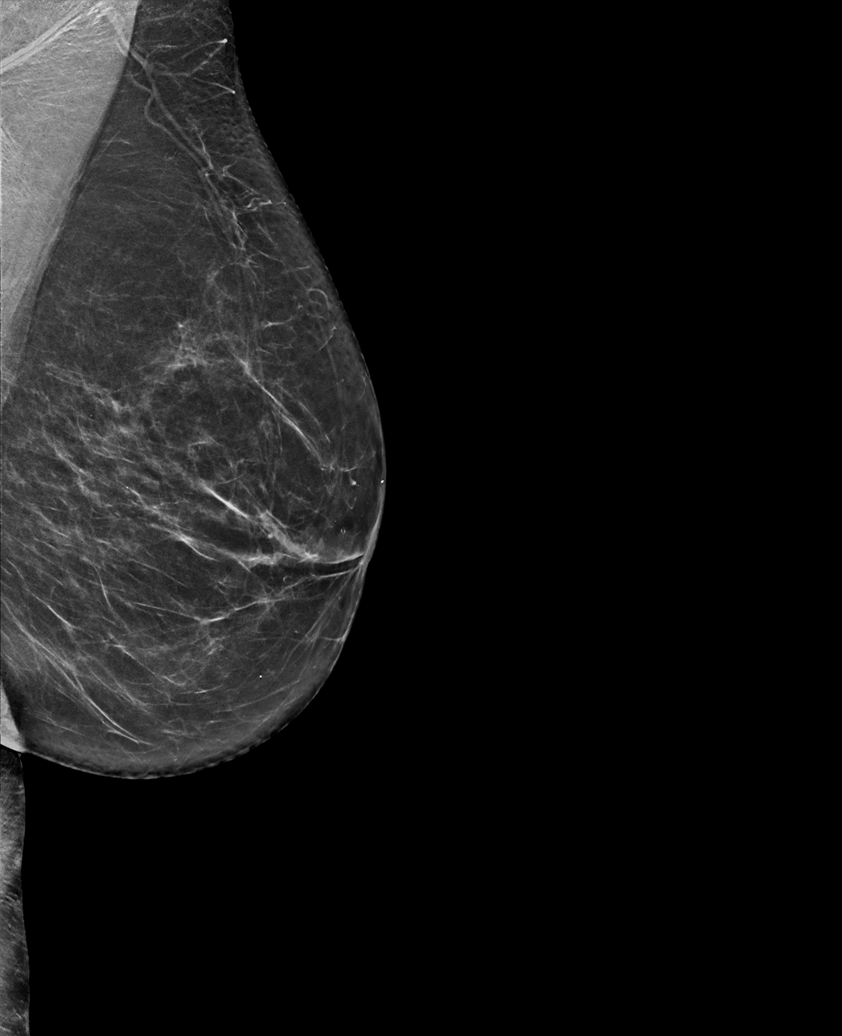

[L CC synth-2D]
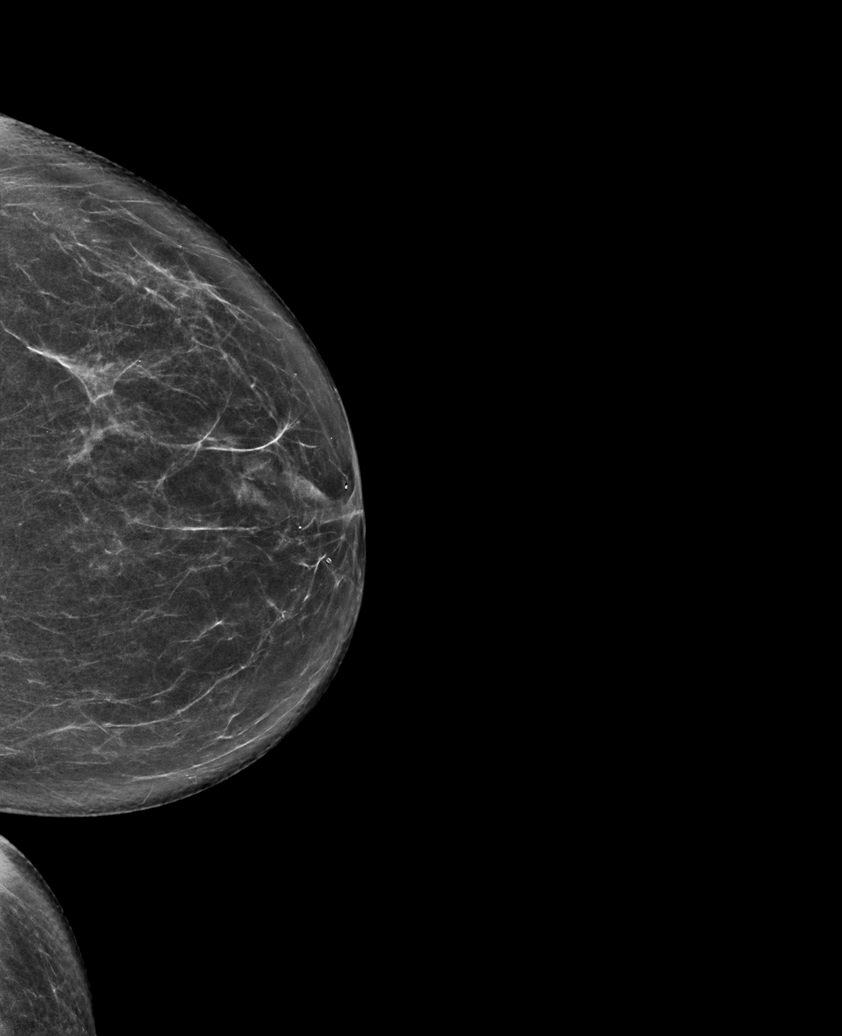

[R MLO synth-2D]
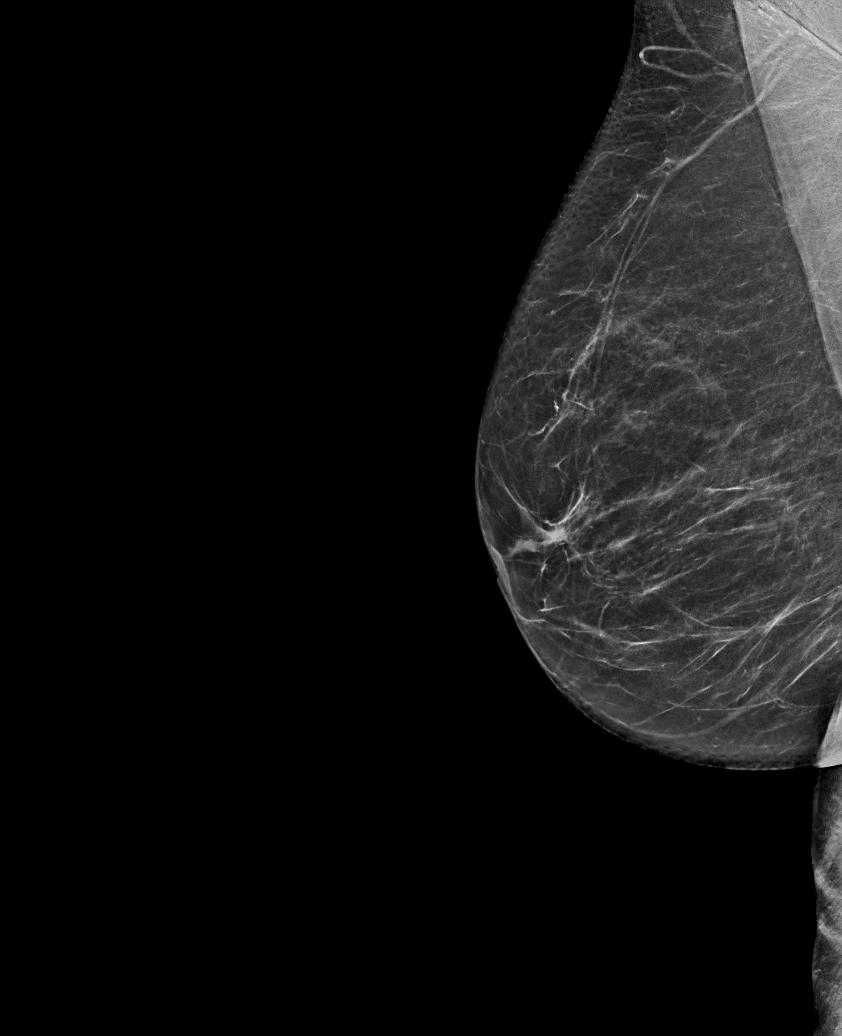

[R CC synth-2D (2 of 2)]
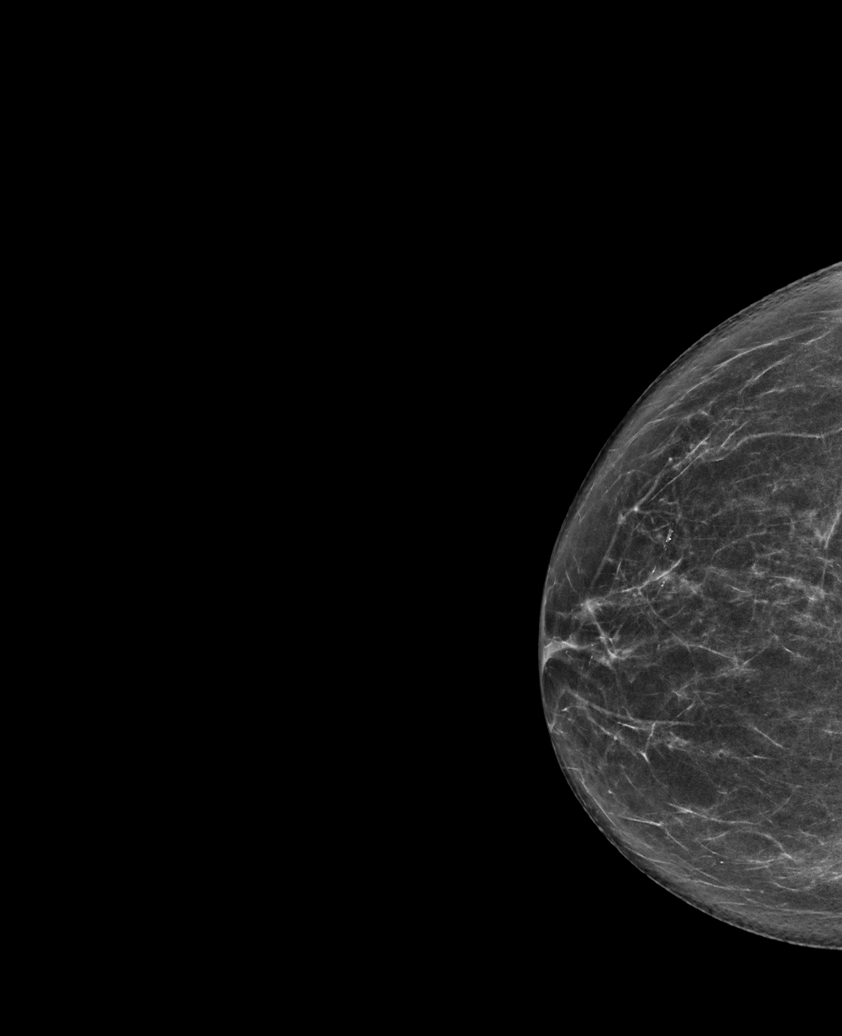

[R CC tomo · tomo slice 37/72.0]
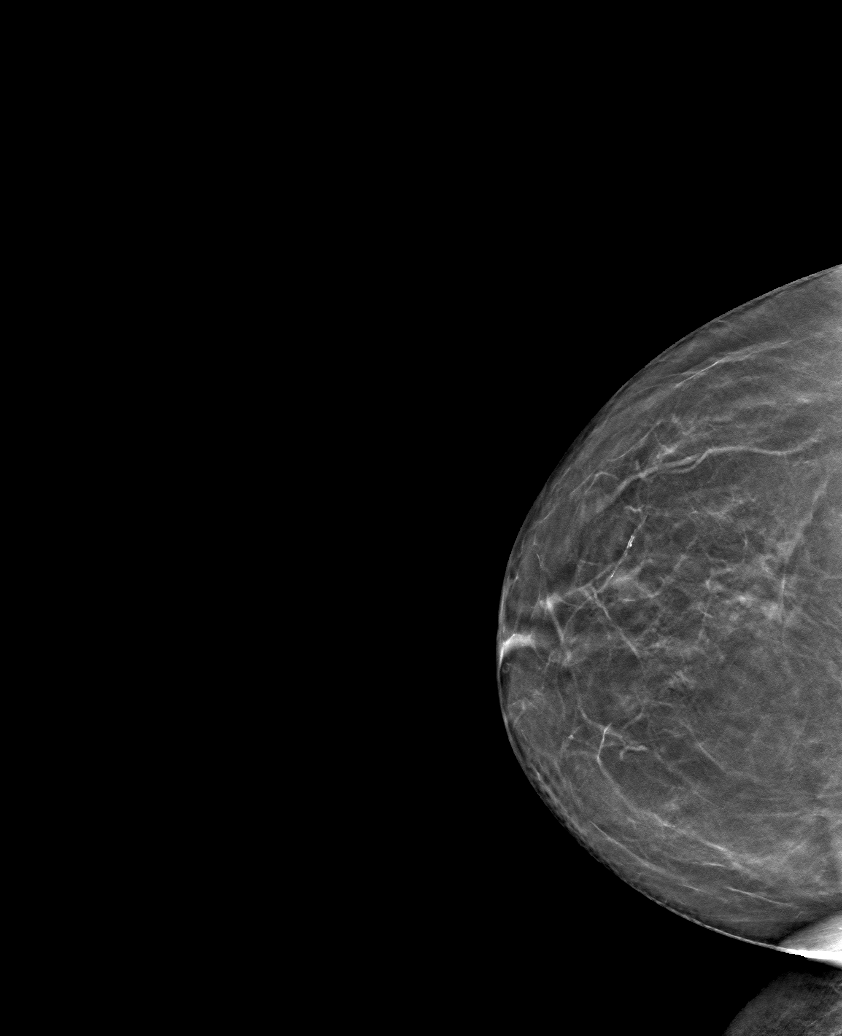

[6 of 30 positions shown; findings below may reference images not displayed]

ACR Breast Density Category b: There are scattered areas of
fibroglandular density.
FINDINGS: There are no findings suspicious for malignancy.
IMPRESSION: No mammographic evidence of malignancy. A result letter of this
screening mammogram will be mailed directly to the patient.

RECOMMENDATION:
Screening mammogram in one year. (Code:51-O-LD2)

BI-RADS CATEGORY  1: Negative.

## 2022-05-06 ENCOUNTER — Ambulatory Visit (INDEPENDENT_AMBULATORY_CARE_PROVIDER_SITE_OTHER): Payer: Medicare Other | Admitting: Dermatology

## 2022-05-06 DIAGNOSIS — L578 Other skin changes due to chronic exposure to nonionizing radiation: Secondary | ICD-10-CM | POA: Diagnosis not present

## 2022-05-06 DIAGNOSIS — L814 Other melanin hyperpigmentation: Secondary | ICD-10-CM

## 2022-05-06 DIAGNOSIS — L821 Other seborrheic keratosis: Secondary | ICD-10-CM | POA: Diagnosis not present

## 2022-05-06 NOTE — Patient Instructions (Signed)
Due to recent changes in healthcare laws, you may see results of your pathology and/or laboratory studies on MyChart before the doctors have had a chance to review them. We understand that in some cases there may be results that are confusing or concerning to you. Please understand that not all results are received at the same time and often the doctors may need to interpret multiple results in order to provide you with the best plan of care or course of treatment. Therefore, we ask that you please give us 2 business days to thoroughly review all your results before contacting the office for clarification. Should we see a critical lab result, you will be contacted sooner.   If You Need Anything After Your Visit  If you have any questions or concerns for your doctor, please call our main line at [phone] and press option 4 to reach your doctor's medical assistant. If no one answers, please leave a voicemail as directed and we will return your call as soon as possible. Messages left after 4 pm will be answered the following business day.   You may also send us a message via MyChart. We typically respond to MyChart messages within 1-2 business days.  For prescription refills, please ask your pharmacy to contact our office. Our fax number is [phone].  If you have an urgent issue when the clinic is closed that cannot wait until the next business day, you can page your doctor at the number below.    Please note that while we do our best to be available for urgent issues outside of office hours, we are not available 24/7.   If you have an urgent issue and are unable to reach us, you may choose to seek medical care at your doctor's office, retail clinic, urgent care center, or emergency room.  If you have a medical emergency, please immediately call 911 or go to the emergency department.  Pager Numbers  - Dr. Kowalski: [phone]  - Dr. Moye: [phone]  - Dr. Stewart:  [phone]  In the event of inclement weather, please call our main line at [phone] for an update on the status of any delays or closures.  Dermatology Medication Tips: Please keep the boxes that topical medications come in in order to help keep track of the instructions about where and how to use these. Pharmacies typically print the medication instructions only on the boxes and not directly on the medication tubes.   If your medication is too expensive, please contact our office at [phone] option 4 or send us a message through MyChart.   We are unable to tell what your co-pay for medications will be in advance as this is different depending on your insurance coverage. However, we may be able to find a substitute medication at lower cost or fill out paperwork to get insurance to cover a needed medication.   If a prior authorization is required to get your medication covered by your insurance company, please allow us 1-2 business days to complete this process.  Drug prices often vary depending on where the prescription is filled and some pharmacies may offer cheaper prices.  The website www.goodrx.com contains coupons for medications through different pharmacies. The prices here do not account for what the cost may be with help from insurance (it may be cheaper with your insurance), but the website can give you the price if you did not use any insurance.  - You can print the associated coupon and take it with   your prescription to the pharmacy.  - You may also stop by our office during regular business hours and pick up a GoodRx coupon card.  - If you need your prescription sent electronically to a different pharmacy, notify our office through [facility] MyChart or by phone at [phone] option 4.     Si Usted Necesita Algo Despus de Su Visita  Tambin puede enviarnos un mensaje a travs de MyChart. Por lo general respondemos a los mensajes de MyChart en el transcurso de 1 a 2  das hbiles.  Para renovar recetas, por favor pida a su farmacia que se ponga en contacto con nuestra oficina. Nuestro nmero de fax es el [phone].  Si tiene un asunto urgente cuando la clnica est cerrada y que no puede esperar hasta el siguiente da hbil, puede llamar/localizar a su doctor(a) al nmero que aparece a continuacin.   Por favor, tenga en cuenta que aunque hacemos todo lo posible para estar disponibles para asuntos urgentes fuera del horario de oficina, no estamos disponibles las 24 horas del da, los 7 das de la semana.   Si tiene un problema urgente y no puede comunicarse con nosotros, puede optar por buscar atencin mdica  en el consultorio de su doctor(a), en una clnica privada, en un centro de atencin urgente o en una sala de emergencias.  Si tiene una emergencia mdica, por favor llame inmediatamente al 911 o vaya a la sala de emergencias.  Nmeros de bper  - Dr. Kowalski: [phone]  - Dra. Moye: [phone]  - Dra. Stewart: [phone]  En caso de inclemencias del tiempo, por favor llame a nuestra lnea principal al [phone] para una actualizacin sobre el estado de cualquier retraso o cierre.  Consejos para la medicacin en dermatologa: Por favor, guarde las cajas en las que vienen los medicamentos de uso tpico para ayudarle a seguir las instrucciones sobre dnde y cmo usarlos. Las farmacias generalmente imprimen las instrucciones del medicamento slo en las cajas y no directamente en los tubos del medicamento.   Si su medicamento es muy caro, por favor, pngase en contacto con nuestra oficina llamando al [phone] y presione la opcin 4 o envenos un mensaje a travs de MyChart.   No podemos decirle cul ser su copago por los medicamentos por adelantado ya que esto es diferente dependiendo de la cobertura de su seguro. Sin embargo, es posible que podamos encontrar un medicamento sustituto a menor costo o llenar un formulario para que el  seguro cubra el medicamento que se considera necesario.   Si se requiere una autorizacin previa para que su compaa de seguros cubra su medicamento, por favor permtanos de 1 a 2 das hbiles para completar este proceso.  Los precios de los medicamentos varan con frecuencia dependiendo del lugar de dnde se surte la receta y alguna farmacias pueden ofrecer precios ms baratos.  El sitio web www.goodrx.com tiene cupones para medicamentos de diferentes farmacias. Los precios aqu no tienen en cuenta lo que podra costar con la ayuda del seguro (puede ser ms barato con su seguro), pero el sitio web puede darle el precio si no utiliz ningn seguro.  - Puede imprimir el cupn correspondiente y llevarlo con su receta a la farmacia.  - Tambin puede pasar por nuestra oficina durante el horario de atencin regular y recoger una tarjeta de cupones de GoodRx.  - Si necesita que su receta se enve electrnicamente a una farmacia diferente, informe a nuestra oficina a travs de MyChart de [facility]   o por telfono llamando al [phone] y presione la opcin 4.  

## 2022-05-06 NOTE — Progress Notes (Signed)
   Follow-Up Visit   Subjective  Jean Lawson is a 77 y.o. female who presents for the following: Irregular skin lesions on the face  (Patient would like them checked and treated today.). The patient has spots, moles and lesions to be evaluated, some may be new or changing.  The following portions of the chart were reviewed this encounter and updated as appropriate:   Tobacco  Allergies  Meds  Problems  Med Hx  Surg Hx  Fam Hx     Review of Systems:  No other skin or systemic complaints except as noted in HPI or Assessment and Plan.  Objective  Well appearing patient in no apparent distress; mood and affect are within normal limits.  A focused examination was performed including the face. Relevant physical exam findings are noted in the Assessment and Plan.  Face Stuck-on, waxy, tan-brown papule or plaque --Discussed benign etiology and prognosis.    Assessment & Plan  Seborrheic keratosis Face Seborrheic Keratoses - Stuck-on, waxy, tan-brown papules and/or plaques  - Benign-appearing - Discussed benign etiology and prognosis. - Observe - Call for any changes -Discussed cosmetic treatment option for nonsymptomatic and noninflamed lesions.  Advised $60 per first lesion and $15 for each additional. Patient declines treatment at this time.  Actinic Damage - chronic, secondary to cumulative UV radiation exposure/sun exposure over time - diffuse scaly erythematous macules with underlying dyspigmentation - Recommend daily broad spectrum sunscreen SPF 30+ to sun-exposed areas, reapply every 2 hours as needed.  - Recommend staying in the shade or wearing long sleeves, sun glasses (UVA+UVB protection) and wide brim hats (4-inch brim around the entire circumference of the hat). - Call for new or changing lesions.  Lentigines - Scattered tan macules - Due to sun exposure - Benign-appearing, observe - Recommend daily broad spectrum sunscreen SPF 30+ to sun-exposed areas,  reapply every 2 hours as needed. - Call for any changes  Return for appointment as scheduled.  Luther Redo, CMA, am acting as scribe for Sarina Ser, MD . Documentation: I have reviewed the above documentation for accuracy and completeness, and I agree with the above.  Sarina Ser, MD

## 2022-05-14 ENCOUNTER — Encounter: Payer: Self-pay | Admitting: Dermatology

## 2022-05-31 ENCOUNTER — Other Ambulatory Visit: Payer: Self-pay | Admitting: Internal Medicine

## 2022-05-31 DIAGNOSIS — Z1231 Encounter for screening mammogram for malignant neoplasm of breast: Secondary | ICD-10-CM

## 2022-07-14 ENCOUNTER — Ambulatory Visit (INDEPENDENT_AMBULATORY_CARE_PROVIDER_SITE_OTHER): Payer: Medicare Other | Admitting: Dermatology

## 2022-07-14 VITALS — BP 130/68

## 2022-07-14 DIAGNOSIS — I8393 Asymptomatic varicose veins of bilateral lower extremities: Secondary | ICD-10-CM

## 2022-07-14 DIAGNOSIS — L814 Other melanin hyperpigmentation: Secondary | ICD-10-CM

## 2022-07-14 DIAGNOSIS — W908XXA Exposure to other nonionizing radiation, initial encounter: Secondary | ICD-10-CM

## 2022-07-14 DIAGNOSIS — L820 Inflamed seborrheic keratosis: Secondary | ICD-10-CM

## 2022-07-14 DIAGNOSIS — Z1283 Encounter for screening for malignant neoplasm of skin: Secondary | ICD-10-CM | POA: Diagnosis not present

## 2022-07-14 DIAGNOSIS — D1801 Hemangioma of skin and subcutaneous tissue: Secondary | ICD-10-CM

## 2022-07-14 DIAGNOSIS — D229 Melanocytic nevi, unspecified: Secondary | ICD-10-CM

## 2022-07-14 DIAGNOSIS — Z872 Personal history of diseases of the skin and subcutaneous tissue: Secondary | ICD-10-CM

## 2022-07-14 DIAGNOSIS — L578 Other skin changes due to chronic exposure to nonionizing radiation: Secondary | ICD-10-CM

## 2022-07-14 DIAGNOSIS — L821 Other seborrheic keratosis: Secondary | ICD-10-CM

## 2022-07-14 DIAGNOSIS — X32XXXA Exposure to sunlight, initial encounter: Secondary | ICD-10-CM

## 2022-07-14 NOTE — Patient Instructions (Addendum)
Cryotherapy Aftercare  Wash gently with soap and water everyday.   Apply Vaseline and Band-Aid daily until healed.     Due to recent changes in healthcare laws, you may see results of your pathology and/or laboratory studies on MyChart before the doctors have had a chance to review them. We understand that in some cases there may be results that are confusing or concerning to you. Please understand that not all results are received at the same time and often the doctors may need to interpret multiple results in order to provide you with the best plan of care or course of treatment. Therefore, we ask that you please give us 2 business days to thoroughly review all your results before contacting the office for clarification. Should we see a critical lab result, you will be contacted sooner.   If You Need Anything After Your Visit  If you have any questions or concerns for your doctor, please call our main line at [phone] and press option 4 to reach your doctor's medical assistant. If no one answers, please leave a voicemail as directed and we will return your call as soon as possible. Messages left after 4 pm will be answered the following business day.   You may also send us a message via MyChart. We typically respond to MyChart messages within 1-2 business days.  For prescription refills, please ask your pharmacy to contact our office. Our fax number is [phone].  If you have an urgent issue when the clinic is closed that cannot wait until the next business day, you can page your doctor at the number below.    Please note that while we do our best to be available for urgent issues outside of office hours, we are not available 24/7.   If you have an urgent issue and are unable to reach us, you may choose to seek medical care at your doctor's office, retail clinic, urgent care center, or emergency room.  If you have a medical emergency, please immediately call 911 or go to the  emergency department.  Pager Numbers  - Dr. Kowalski: [phone]  - Dr. Moye: [phone]  - Dr. Stewart: [phone]  In the event of inclement weather, please call our main line at [phone] for an update on the status of any delays or closures.  Dermatology Medication Tips: Please keep the boxes that topical medications come in in order to help keep track of the instructions about where and how to use these. Pharmacies typically print the medication instructions only on the boxes and not directly on the medication tubes.   If your medication is too expensive, please contact our office at [phone] option 4 or send us a message through MyChart.   We are unable to tell what your co-pay for medications will be in advance as this is different depending on your insurance coverage. However, we may be able to find a substitute medication at lower cost or fill out paperwork to get insurance to cover a needed medication.   If a prior authorization is required to get your medication covered by your insurance company, please allow us 1-2 business days to complete this process.  Drug prices often vary depending on where the prescription is filled and some pharmacies may offer cheaper prices.  The website www.goodrx.com contains coupons for medications through different pharmacies. The prices here do not account for what the cost may be with help from insurance (it may be cheaper with your insurance), but the website can   give you the price if you did not use any insurance.  - You can print the associated coupon and take it with your prescription to the pharmacy.  - You may also stop by our office during regular business hours and pick up a GoodRx coupon card.  - If you need your prescription sent electronically to a different pharmacy, notify our office through [facility] MyChart or by phone at [phone] option 4.     Si Usted Necesita Algo Despus de Su Visita  Tambin puede  enviarnos un mensaje a travs de MyChart. Por lo general respondemos a los mensajes de MyChart en el transcurso de 1 a 2 das hbiles.  Para renovar recetas, por favor pida a su farmacia que se ponga en contacto con nuestra oficina. Nuestro nmero de fax es el [phone].  Si tiene un asunto urgente cuando la clnica est cerrada y que no puede esperar hasta el siguiente da hbil, puede llamar/localizar a su doctor(a) al nmero que aparece a continuacin.   Por favor, tenga en cuenta que aunque hacemos todo lo posible para estar disponibles para asuntos urgentes fuera del horario de oficina, no estamos disponibles las 24 horas del da, los 7 das de la semana.   Si tiene un problema urgente y no puede comunicarse con nosotros, puede optar por buscar atencin mdica  en el consultorio de su doctor(a), en una clnica privada, en un centro de atencin urgente o en una sala de emergencias.  Si tiene una emergencia mdica, por favor llame inmediatamente al 911 o vaya a la sala de emergencias.  Nmeros de bper  - Dr. Kowalski: [phone]  - Dra. Moye: [phone]  - Dra. Stewart: [phone]  En caso de inclemencias del tiempo, por favor llame a nuestra lnea principal al [phone] para una actualizacin sobre el estado de cualquier retraso o cierre.  Consejos para la medicacin en dermatologa: Por favor, guarde las cajas en las que vienen los medicamentos de uso tpico para ayudarle a seguir las instrucciones sobre dnde y cmo usarlos. Las farmacias generalmente imprimen las instrucciones del medicamento slo en las cajas y no directamente en los tubos del medicamento.   Si su medicamento es muy caro, por favor, pngase en contacto con nuestra oficina llamando al [phone] y presione la opcin 4 o envenos un mensaje a travs de MyChart.   No podemos decirle cul ser su copago por los medicamentos por adelantado ya que esto es diferente dependiendo de la cobertura de su seguro.  Sin embargo, es posible que podamos encontrar un medicamento sustituto a menor costo o llenar un formulario para que el seguro cubra el medicamento que se considera necesario.   Si se requiere una autorizacin previa para que su compaa de seguros cubra su medicamento, por favor permtanos de 1 a 2 das hbiles para completar este proceso.  Los precios de los medicamentos varan con frecuencia dependiendo del lugar de dnde se surte la receta y alguna farmacias pueden ofrecer precios ms baratos.  El sitio web www.goodrx.com tiene cupones para medicamentos de diferentes farmacias. Los precios aqu no tienen en cuenta lo que podra costar con la ayuda del seguro (puede ser ms barato con su seguro), pero el sitio web puede darle el precio si no utiliz ningn seguro.  - Puede imprimir el cupn correspondiente y llevarlo con su receta a la farmacia.  - Tambin puede pasar por nuestra oficina durante el horario de atencin regular y recoger una tarjeta de cupones de GoodRx.  -   Si necesita que su receta se enve electrnicamente a una farmacia diferente, informe a nuestra oficina a travs de MyChart de [facility] o por telfono llamando al [phone] y presione la opcin 4.  

## 2022-07-14 NOTE — Progress Notes (Signed)
Follow-Up Visit   Subjective  Jean Lawson is a 77 y.o. female who presents for the following: Skin Cancer Screening and Full Body Skin Exam, hx of AKs  The patient presents for Total-Body Skin Exam (TBSE) for skin cancer screening and mole check. The patient has spots, moles and lesions to be evaluated, some may be new or changing and the patient has concerns that these could be cancer.    The following portions of the chart were reviewed this encounter and updated as appropriate: medications, allergies, medical history  Review of Systems:  No other skin or systemic complaints except as noted in HPI or Assessment and Plan.  Objective  Well appearing patient in no apparent distress; mood and affect are within normal limits.  A full examination was performed including scalp, head, eyes, ears, nose, lips, neck, chest, axillae, abdomen, back, buttocks, bilateral upper extremities, bilateral lower extremities, hands, feet, fingers, toes, fingernails, and toenails. All findings within normal limits unless otherwise noted below.   Relevant physical exam findings are noted in the Assessment and Plan.  Left Popliteal Fossa x 1 Stuck on waxy paps with erythema    Assessment & Plan   LENTIGINES, SEBORRHEIC KERATOSES, HEMANGIOMAS - Benign normal skin lesions - Benign-appearing - Call for any changes  MELANOCYTIC NEVI - Tan-brown and/or pink-flesh-colored symmetric macules and papules - Benign appearing on exam today - Observation - Call clinic for new or changing moles - Recommend daily use of broad spectrum spf 30+ sunscreen to sun-exposed areas.   ACTINIC DAMAGE - Chronic condition, secondary to cumulative UV/sun exposure - diffuse scaly erythematous macules with underlying dyspigmentation - Recommend daily broad spectrum sunscreen SPF 30+ to sun-exposed areas, reapply every 2 hours as needed.  - Staying in the shade or wearing long sleeves, sun glasses (UVA+UVB protection) and  wide brim hats (4-inch brim around the entire circumference of the hat) are also recommended for sun protection.  - Call for new or changing lesions.  SKIN CANCER SCREENING PERFORMED TODAY.  HISTORY OF PRECANCEROUS ACTINIC KERATOSIS - site(s) of PreCancerous Actinic Keratosis clear today. - these may recur and new lesions may form requiring treatment to prevent transformation into skin cancer - observe for new or changing spots and contact [facility] for appointment if occur - photoprotection with sun protective clothing; sunglasses and broad spectrum sunscreen with SPF of at least 30 + and frequent self skin exams recommended - yearly exams by a dermatologist recommended for persons with history of PreCancerous Actinic Keratoses   Varicose Veins/Spider Veins - Dilated blue, purple or red veins at the lower extremities - Reassured - Smaller vessels can be treated by sclerotherapy (a procedure to inject a medicine into the veins to make them disappear) if desired, but the treatment is not covered by insurance. Larger vessels may be covered if symptomatic and we would refer to vascular surgeon if treatment desired.   Inflamed seborrheic keratosis Left Popliteal Fossa x 1  Symptomatic, irritating, patient would like treated.   Destruction of lesion - Left Popliteal Fossa x 1 Complexity: simple   Destruction method: cryotherapy   Informed consent: discussed and consent obtained   Timeout:  patient name, date of birth, surgical site, and procedure verified Lesion destroyed using liquid nitrogen: Yes   Region frozen until ice ball extended beyond lesion: Yes   Outcome: patient tolerated procedure well with no complications   Post-procedure details: wound care instructions given     Return in about 2 years (around  07/13/2024) for TBSE, Hx of AKs.  I, Ardis Rowan, RMA, am acting as scribe for Armida Sans, MD .   Documentation: I have reviewed the above documentation for  accuracy and completeness, and I agree with the above.  Armida Sans, MD

## 2022-07-23 ENCOUNTER — Encounter: Payer: Self-pay | Admitting: Dermatology

## 2022-09-13 ENCOUNTER — Ambulatory Visit
Admission: RE | Admit: 2022-09-13 | Discharge: 2022-09-13 | Disposition: A | Discharge status: Home / Self Care | Payer: Medicare Other | Source: Ambulatory Visit | Attending: Internal Medicine | Admitting: Internal Medicine

## 2022-09-13 DIAGNOSIS — Z1231 Encounter for screening mammogram for malignant neoplasm of breast: Secondary | ICD-10-CM | POA: Insufficient documentation

## 2022-09-15 ENCOUNTER — Other Ambulatory Visit: Payer: Self-pay | Admitting: Internal Medicine

## 2022-09-15 DIAGNOSIS — N6489 Other specified disorders of breast: Secondary | ICD-10-CM

## 2022-09-15 DIAGNOSIS — R928 Other abnormal and inconclusive findings on diagnostic imaging of breast: Secondary | ICD-10-CM

## 2022-09-17 ENCOUNTER — Ambulatory Visit: Admission: RE | Admit: 2022-09-17 | Payer: Medicare Other | Source: Ambulatory Visit

## 2022-09-17 ENCOUNTER — Ambulatory Visit
Admission: RE | Admit: 2022-09-17 | Discharge: 2022-09-17 | Disposition: A | Discharge status: Home / Self Care | Payer: Medicare Other | Source: Ambulatory Visit | Attending: Internal Medicine | Admitting: Internal Medicine

## 2022-09-17 DIAGNOSIS — R928 Other abnormal and inconclusive findings on diagnostic imaging of breast: Secondary | ICD-10-CM

## 2022-09-17 DIAGNOSIS — N6489 Other specified disorders of breast: Secondary | ICD-10-CM | POA: Insufficient documentation

## 2023-04-11 ENCOUNTER — Other Ambulatory Visit: Payer: Self-pay | Admitting: Internal Medicine

## 2023-04-11 DIAGNOSIS — Z1231 Encounter for screening mammogram for malignant neoplasm of breast: Secondary | ICD-10-CM

## 2023-09-27 ENCOUNTER — Ambulatory Visit
Admission: RE | Admit: 2023-09-27 | Discharge: 2023-09-27 | Disposition: A | Discharge status: Home / Self Care | Source: Ambulatory Visit | Attending: Internal Medicine | Admitting: Internal Medicine

## 2023-09-27 DIAGNOSIS — Z1231 Encounter for screening mammogram for malignant neoplasm of breast: Secondary | ICD-10-CM | POA: Diagnosis present

## 2023-10-11 ENCOUNTER — Encounter: Payer: Self-pay | Admitting: Dermatology

## 2023-10-11 ENCOUNTER — Ambulatory Visit (INDEPENDENT_AMBULATORY_CARE_PROVIDER_SITE_OTHER): Admitting: Dermatology

## 2023-10-11 DIAGNOSIS — D1801 Hemangioma of skin and subcutaneous tissue: Secondary | ICD-10-CM

## 2023-10-11 DIAGNOSIS — S0031XA Abrasion of nose, initial encounter: Secondary | ICD-10-CM

## 2023-10-11 DIAGNOSIS — T148XXA Other injury of unspecified body region, initial encounter: Secondary | ICD-10-CM

## 2023-10-11 DIAGNOSIS — L814 Other melanin hyperpigmentation: Secondary | ICD-10-CM

## 2023-10-11 DIAGNOSIS — D229 Melanocytic nevi, unspecified: Secondary | ICD-10-CM

## 2023-10-11 DIAGNOSIS — L578 Other skin changes due to chronic exposure to nonionizing radiation: Secondary | ICD-10-CM

## 2023-10-11 DIAGNOSIS — D692 Other nonthrombocytopenic purpura: Secondary | ICD-10-CM

## 2023-10-11 DIAGNOSIS — W908XXA Exposure to other nonionizing radiation, initial encounter: Secondary | ICD-10-CM

## 2023-10-11 DIAGNOSIS — Z1283 Encounter for screening for malignant neoplasm of skin: Secondary | ICD-10-CM | POA: Diagnosis not present

## 2023-10-11 DIAGNOSIS — Z7189 Other specified counseling: Secondary | ICD-10-CM

## 2023-10-11 DIAGNOSIS — I781 Nevus, non-neoplastic: Secondary | ICD-10-CM

## 2023-10-11 NOTE — Patient Instructions (Addendum)
 Seborrheic Keratosis  What causes seborrheic keratoses? Seborrheic keratoses are harmless, common skin growths that first appear during adult life.  As time goes by, more growths appear.  Some people may develop a large number of them.  Seborrheic keratoses appear on both covered and uncovered body parts.  They are not caused by sunlight.  The tendency to develop seborrheic keratoses can be inherited.  They vary in color from skin-colored to gray, brown, or even black.  They can be either smooth or have a rough, warty surface.   Seborrheic keratoses are superficial and look as if they were stuck on the skin.  Under the microscope this type of keratosis looks like layers upon layers of skin.  That is why at times the top layer may seem to fall off, but the rest of the growth remains and re-grows.    Treatment Seborrheic keratoses do not need to be treated, but can easily be removed in the office.  Seborrheic keratoses often cause symptoms when they rub on clothing or jewelry.  Lesions can be in the way of shaving.  If they become inflamed, they can cause itching, soreness, or burning.  Removal of a seborrheic keratosis can be accomplished by freezing, burning, or surgery. If any spot bleeds, scabs, or grows rapidly, please return to have it checked, as these can be an indication of a skin cancer.   Melanoma ABCDEs  Melanoma is the most dangerous type of skin cancer, and is the leading cause of death from skin disease.  You are more likely to develop melanoma if you: Have light-colored skin, light-colored eyes, or red or blond hair Spend a lot of time in the sun Tan regularly, either outdoors or in a tanning bed Have had blistering sunburns, especially during childhood Have a close family member who has had a melanoma Have atypical moles or large birthmarks  Early detection of melanoma is key since treatment is typically straightforward and cure rates are extremely high if we catch it early.    The first sign of melanoma is often a change in a mole or a new dark spot.  The ABCDE system is a way of remembering the signs of melanoma.  A for asymmetry:  The two halves do not match. B for border:  The edges of the growth are irregular. C for color:  A mixture of colors are present instead of an even brown color. D for diameter:  Melanomas are usually (but not always) greater than 6mm - the size of a pencil eraser. E for evolution:  The spot keeps changing in size, shape, and color.  Please check your skin once per month between visits. You can use a small mirror in front and a large mirror behind you to keep an eye on the back side or your body.   If you see any new or changing lesions before your next follow-up, please call to schedule a visit.  Please continue daily skin protection including broad spectrum sunscreen SPF 30+ to sun-exposed areas, reapplying every 2 hours as needed when you're outdoors.   Staying in the shade or wearing long sleeves, sun glasses (UVA+UVB protection) and wide brim hats (4-inch brim around the entire circumference of the hat) are also recommended for sun protection.    Due to recent changes in healthcare laws, you may see results of your pathology and/or laboratory studies on MyChart before the doctors have had a chance to review them. We understand that in some cases there may  be results that are confusing or concerning to you. Please understand that not all results are received at the same time and often the doctors may need to interpret multiple results in order to provide you with the best plan of care or course of treatment. Therefore, we ask that you please give [place] 2 business days to thoroughly review all your results before contacting the office for clarification. Should we see a critical lab result, you will be contacted sooner.   If You Need Anything After Your Visit  If you have any questions or concerns for your doctor, please call our main  line at [phone] and press option 4 to reach your doctor's medical assistant. If no one answers, please leave a voicemail as directed and we will return your call as soon as possible. Messages left after 4 pm will be answered the following business day.   You may also send [place] a message via MyChart. We typically respond to MyChart messages within 1-2 business days.  For prescription refills, please ask your pharmacy to contact our office. Our fax number is [phone].  If you have an urgent issue when the clinic is closed that cannot wait until the next business day, you can page your doctor at the number below.    Please note that while we do our best to be available for urgent issues outside of office hours, we are not available 24/7.   If you have an urgent issue and are unable to reach [place], you may choose to seek medical care at your doctor's office, retail clinic, urgent care center, or emergency room.  If you have a medical emergency, please immediately call 911 or go to the emergency department.  Pager Numbers  - Dr. Gwen Pounds: [phone]  - Dr. Roseanne Reno: [phone]  - Dr. Katrinka Blazing: [phone]   In the event of inclement weather, please call our main line at [phone] for an update on the status of any delays or closures.  Dermatology Medication Tips: Please keep the boxes that topical medications come in in order to help keep track of the instructions about where and how to use these. Pharmacies typically print the medication instructions only on the boxes and not directly on the medication tubes.   If your medication is too expensive, please contact our office at [phone] option 4 or send [place] a message through MyChart.   We are unable to tell what your co-pay for medications will be in advance as this is different depending on your insurance coverage. However, we may be able to find a substitute medication at lower cost or fill out paperwork to get insurance to cover  a needed medication.   If a prior authorization is required to get your medication covered by your insurance company, please allow [place] 1-2 business days to complete this process.  Drug prices often vary depending on where the prescription is filled and some pharmacies may offer cheaper prices.  The website www.goodrx.com contains coupons for medications through different pharmacies. The prices here do not account for what the cost may be with help from insurance (it may be cheaper with your insurance), but the website can give you the price if you did not use any insurance.  - You can print the associated coupon and take it with your prescription to the pharmacy.  - You may also stop by our office during regular business hours and pick up a GoodRx coupon card.  - If you need your prescription sent electronically to  a different pharmacy, notify our office through [facility] or by phone at [phone] option 4.     Si Usted Necesita Algo Despus de Su Visita  Tambin puede enviarnos un mensaje a travs de Clinical cytogeneticist. Por lo general respondemos a los mensajes de MyChart en el transcurso de 1 a 2 das hbiles.  Para renovar recetas, por favor pida a su farmacia que se ponga en contacto con nuestra oficina. Annie Sable de fax es [place] [phone].  Si tiene un asunto urgente cuando la clnica est cerrada y que no puede esperar hasta el siguiente da hbil, puede llamar/localizar a su doctor(a) al nmero que aparece a continuacin.   Por favor, tenga en cuenta que aunque hacemos todo lo posible para estar disponibles para asuntos urgentes fuera del horario de [place], no estamos disponibles las 24 horas del da, los 7 [address]  [address] de la [place].   Si tiene un problema urgente y no puede comunicarse con nosotros, puede optar por buscar atencin mdica  en el consultorio de su doctor(a), en una clnica privada, en un centro de atencin urgente o en una sala de emergencias.  Si tiene Psychologist, clinical, por favor llame inmediatamente al 911 o vaya a la sala de emergencias.  Nmeros de bper  - Dr. Gwen Pounds: [phone]  - Dra. Roseanne Reno: [phone]  - Dr. Katrinka Blazing: [phone]   En caso de inclemencias del tiempo, por favor llame a Lacy Duverney principal al [phone] para una actualizacin sobre el [place] de cualquier retraso o cierre.  Consejos para la medicacin en dermatologa: Por favor, guarde las cajas en las que vienen los medicamentos de uso tpico para ayudarle a seguir las instrucciones sobre dnde y cmo usarlos. Las farmacias generalmente imprimen las instrucciones del medicamento slo en las cajas y no directamente en los tubos del [place].   Si su medicamento es muy caro, por favor, pngase en contacto con Rolm Gala llamando al [phone] y presione la opcin 4 o envenos un mensaje a travs de Clinical cytogeneticist.   No podemos decirle cul ser su copago por los medicamentos por adelantado ya que esto es diferente dependiendo de la cobertura de su seguro. Sin embargo, es posible que podamos encontrar un medicamento sustituto a Audiological scientist un formulario para que el seguro cubra el medicamento que se considera necesario.   Si se requiere una autorizacin previa para que su compaa de seguros [place] su medicamento, por favor permtanos de 1 a 2 das hbiles para completar [address]  Los precios de los medicamentos varan con frecuencia dependiendo del Environmental consultant de dnde se surte la receta y alguna farmacias pueden ofrecer precios ms baratos.  El sitio web www.goodrx.com tiene cupones para medicamentos de Health and safety inspector. Los precios aqu no tienen en cuenta lo que podra costar con la ayuda del seguro (puede ser ms barato con su seguro), pero el sitio web puede darle el precio si no utiliz Tourist information centre manager.  - Puede imprimir el cupn correspondiente y llevarlo con su receta a la farmacia.  - Tambin puede pasar por nuestra oficina durante el horario de  atencin regular y Education officer, museum una tarjeta de cupones de GoodRx.  - Si necesita que su receta se enve electrnicamente a una farmacia diferente, informe a nuestra oficina a travs de MyChart de [place] o por telfono llamando al [phone] y presione la opcin 4.

## 2023-10-11 NOTE — Progress Notes (Signed)
   Follow-Up Visit   Subjective  Jean Lawson is a 78 y.o. female who presents for the following: Skin Cancer Screening and Full Body Skin Exam hx of aks, hx of isk, hx of varicose veins   The patient presents for Total-Body Skin Exam (TBSE) for skin cancer screening and mole check. The patient has spots, moles and lesions to be evaluated, some may be new or changing and the patient may have concern these could be cancer.  The following portions of the chart were reviewed this encounter and updated as appropriate: medications, allergies, medical history  Review of Systems:  No other skin or systemic complaints except as noted in HPI or Assessment and Plan.  Objective  Well appearing patient in no apparent distress; mood and affect are within normal limits.  A full examination was performed including scalp, head, eyes, ears, nose, lips, neck, chest, axillae, abdomen, back, buttocks, bilateral upper extremities, bilateral lower extremities, hands, feet, fingers, toes, fingernails, and toenails. All findings within normal limits unless otherwise noted below.   Relevant physical exam findings are noted in the Assessment and Plan.   Assessment & Plan   SKIN CANCER SCREENING PERFORMED TODAY.  ACTINIC DAMAGE - Chronic condition, secondary to cumulative UV/sun exposure - diffuse scaly erythematous macules with underlying dyspigmentation - Recommend daily broad spectrum sunscreen SPF 30+ to sun-exposed areas, reapply every 2 hours as needed.  - Staying in the shade or wearing long sleeves, sun glasses (UVA+UVB protection) and wide brim hats (4-inch brim around the entire circumference of the hat) are also recommended for sun protection.  - Call for new or changing lesions.  LENTIGINES, SEBORRHEIC KERATOSES, HEMANGIOMAS - Benign normal skin lesions - Benign-appearing - Call for any changes  MELANOCYTIC NEVI - Tan-brown and/or pink-flesh-colored symmetric macules and papules - Benign  appearing on exam today - Observation - Call clinic for new or changing moles - Recommend daily use of broad spectrum spf 30+ sunscreen to sun-exposed areas.   Varicose Veins/Spider Veins - Dilated blue, purple or red veins at the lower extremities - Reassured - Smaller vessels can be treated by sclerotherapy (a procedure to inject a medicine into the veins to make them disappear) if desired, but the treatment is not covered by insurance. Larger vessels may be covered if symptomatic and we would refer to vascular surgeon if treatment desired.  ABRASION ON NOSE  From recent trip and fall Exam:  Treatment Plan: Benign. Appearing.  Hx of trauma at nose from recent fall  No recommended treatment  ECCHYMOSIS AT LOWER LEGS From recent trip and fall Exam: purpura legs Treatment Plan: Benign. Observe Hx of trauma and bruising from recent fall No recommended treatment    Return in 1 year (on 10/10/2024) for TBSE.  IEleanor Blush, CMA, am acting as scribe for Alm Rhyme, MD.   Documentation: I have reviewed the above documentation for accuracy and completeness, and I agree with the above.  Alm Rhyme, MD

## 2024-10-11 ENCOUNTER — Ambulatory Visit: Admitting: Dermatology
